# Patient Record
Sex: Female | Born: 1955 | Race: White | Hispanic: No | Marital: Single | Smoking: Never smoker
Health system: Southern US, Community
[De-identification: ages and names within clinical notes are randomized; demographics above are authoritative.]

## PROBLEM LIST (undated history)

## (undated) DIAGNOSIS — R011 Cardiac murmur, unspecified: Secondary | ICD-10-CM

## (undated) DIAGNOSIS — G629 Polyneuropathy, unspecified: Secondary | ICD-10-CM

## (undated) DIAGNOSIS — H269 Unspecified cataract: Secondary | ICD-10-CM

## (undated) DIAGNOSIS — K289 Gastrojejunal ulcer, unspecified as acute or chronic, without hemorrhage or perforation: Secondary | ICD-10-CM

## (undated) DIAGNOSIS — E119 Type 2 diabetes mellitus without complications: Secondary | ICD-10-CM

## (undated) DIAGNOSIS — Z9289 Personal history of other medical treatment: Secondary | ICD-10-CM

## (undated) DIAGNOSIS — H409 Unspecified glaucoma: Secondary | ICD-10-CM

## (undated) DIAGNOSIS — J302 Other seasonal allergic rhinitis: Secondary | ICD-10-CM

## (undated) DIAGNOSIS — I10 Essential (primary) hypertension: Secondary | ICD-10-CM

## (undated) DIAGNOSIS — K219 Gastro-esophageal reflux disease without esophagitis: Secondary | ICD-10-CM

## (undated) DIAGNOSIS — Z993 Dependence on wheelchair: Secondary | ICD-10-CM

## (undated) HISTORY — PX: TONSILLECTOMY: SUR1361

## (undated) HISTORY — PX: WISDOM TOOTH EXTRACTION: SHX21

## (undated) HISTORY — PX: FRACTURE SURGERY: SHX138

## (undated) HISTORY — PX: CATARACT EXTRACTION: SUR2

## (undated) HISTORY — PX: EYE SURGERY: SHX253

## (undated) HISTORY — PX: UPPER GI ENDOSCOPY: SHX6162

## (undated) HISTORY — PX: COLONOSCOPY: SHX174

## (undated) HISTORY — PX: RETINAL DETACHMENT SURGERY: SHX105

## (undated) HISTORY — PX: BREAST SURGERY: SHX581

## (undated) HISTORY — PX: ABDOMINAL HYSTERECTOMY: SHX81

## (undated) HISTORY — PX: TONSILLECTOMY AND ADENOIDECTOMY: SHX28

---

## 2003-12-26 ENCOUNTER — Encounter: Admission: RE | Admit: 2003-12-26 | Discharge: 2003-12-26 | Payer: Self-pay | Admitting: Gastroenterology

## 2005-02-20 ENCOUNTER — Emergency Department (HOSPITAL_COMMUNITY): Admission: EM | Admit: 2005-02-20 | Discharge: 2005-02-20 | Payer: Self-pay | Admitting: Emergency Medicine

## 2005-02-21 ENCOUNTER — Emergency Department (HOSPITAL_COMMUNITY): Admission: EM | Admit: 2005-02-21 | Discharge: 2005-02-21 | Payer: Self-pay | Admitting: Emergency Medicine

## 2005-02-27 ENCOUNTER — Inpatient Hospital Stay (HOSPITAL_COMMUNITY): Admission: EM | Admit: 2005-02-27 | Discharge: 2005-03-06 | Payer: Self-pay | Admitting: Emergency Medicine

## 2005-02-27 ENCOUNTER — Encounter: Admission: RE | Admit: 2005-02-27 | Discharge: 2005-02-27 | Payer: Self-pay | Admitting: *Deleted

## 2006-02-23 ENCOUNTER — Emergency Department (HOSPITAL_COMMUNITY): Admission: EM | Admit: 2006-02-23 | Discharge: 2006-02-23 | Payer: Self-pay | Admitting: Emergency Medicine

## 2010-07-07 ENCOUNTER — Emergency Department (HOSPITAL_COMMUNITY): Admission: EM | Admit: 2010-07-07 | Discharge: 2010-07-07 | Payer: Self-pay | Admitting: Emergency Medicine

## 2010-10-28 LAB — URINE CULTURE: Colony Count: 35000

## 2010-10-28 LAB — CBC
HCT: 37.6 % (ref 36.0–46.0)
MCH: 29.2 pg (ref 26.0–34.0)
MCV: 85.2 fL (ref 78.0–100.0)
Platelets: 220 10*3/uL (ref 150–400)
RDW: 13.3 % (ref 11.5–15.5)
WBC: 6.6 10*3/uL (ref 4.0–10.5)

## 2010-10-28 LAB — URINALYSIS, ROUTINE W REFLEX MICROSCOPIC
Bilirubin Urine: NEGATIVE
Ketones, ur: 15 mg/dL — AB
Nitrite: NEGATIVE
Protein, ur: NEGATIVE mg/dL
Urobilinogen, UA: 0.2 mg/dL (ref 0.0–1.0)

## 2010-10-28 LAB — HEPATIC FUNCTION PANEL
ALT: 16 U/L (ref 0–35)
Alkaline Phosphatase: 68 U/L (ref 39–117)
Bilirubin, Direct: 0.1 mg/dL (ref 0.0–0.3)
Indirect Bilirubin: 0.6 mg/dL (ref 0.3–0.9)
Total Protein: 7 g/dL (ref 6.0–8.3)

## 2010-10-28 LAB — POCT I-STAT, CHEM 8
Calcium, Ion: 1.07 mmol/L — ABNORMAL LOW (ref 1.12–1.32)
Creatinine, Ser: 1 mg/dL (ref 0.4–1.2)
Glucose, Bld: 319 mg/dL — ABNORMAL HIGH (ref 70–99)
HCT: 42 % (ref 36.0–46.0)
Hemoglobin: 14.3 g/dL (ref 12.0–15.0)
TCO2: 28 mmol/L (ref 0–100)

## 2010-10-28 LAB — PROTIME-INR: Prothrombin Time: 13 seconds (ref 11.6–15.2)

## 2010-10-28 LAB — URINE MICROSCOPIC-ADD ON

## 2010-10-28 LAB — DIFFERENTIAL
Eosinophils Absolute: 0 10*3/uL (ref 0.0–0.7)
Eosinophils Relative: 1 % (ref 0–5)
Lymphocytes Relative: 20 % (ref 12–46)
Lymphs Abs: 1.3 10*3/uL (ref 0.7–4.0)
Monocytes Absolute: 0.4 10*3/uL (ref 0.1–1.0)

## 2011-01-02 NOTE — Discharge Summary (Signed)
NAMESANAZ, [name]            ACCOUNT NO.:  [id number]   MEDICAL RECORD NO.:  [id number]          PATIENT TYPE:  INP   LOCATION:  1430                         FACILITY:  [facility]   PHYSICIAN:  Vikki Ports, MDDATE OF BIRTH:  [birth year]   DATE OF ADMISSION:  02/27/2005  DATE OF DISCHARGE:  03/06/2005                                 DISCHARGE SUMMARY   ADMISSION DIAGNOSIS:  History of perforated ulcer managed medically.   The patient now presents with some abdominal pain and on CAT scan was found  to have increased paraduodenal fluid collection. The patient was placed with  an NG tube and antibiotics and was followed closely over the next few days  while she remained n.p.o. Her pain got much better. Her repeat CAT scan  showed no evidence of gastric outlet obstruction. I clamped her NG tube. She  tolerated that well. We stopped her TNA, began feeding her, and she was  ready for discharge home.   Follow-up is with me in 2 weeks.   CONDITION ON DISCHARGE:  Good and improved.      Vikki Ports, MD  Electronically Signed     KRH/MEDQ  D:  03/26/2005  T:  03/26/2005  Job:  [id number]

## 2011-01-02 NOTE — Consult Note (Signed)
NAMEDALONDA, [name]            ACCOUNT NO.:  [id number]   MEDICAL RECORD NO.:  [id number]          PATIENT TYPE:  EMS   LOCATION:  ED                           FACILITY:  [facility]   PHYSICIAN:  Vikki Ports, MDDATE OF BIRTH:  May 11, 1956   DATE OF CONSULTATION:  02/20/2005  DATE OF DISCHARGE:                                   CONSULTATION   DIAGNOSIS:  Abdominal pain, rule out duodenal ulcer.   HISTORY OF PRESENT ILLNESS:  Patient is a 55 year old white female who  presented to the emergency room with a one-day history of worsening  abdominal pain.  She was seen here and evaluated by Dr. Berenice Bouton, who  ordered a CT scan.  The CT scan showed a small amount of fluid in the right  upper quadrant adjacent to the duodenum and paraduodenal fatty inflammation.  No evidence of free air.   On request for my consultation, the patient was completely painfree without  pain medicine for the last five hours.  She is hungry at this point and  denies any abdominal pain whatsoever.  She did suffer a tib/fib fracture  from a fall a number of weeks ago and had external fixator placed after  ORIF.  The patient denies any use of NSAIDs.  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 Nadine Counts Buccini for the last three years.   PAST MEDICAL HISTORY:  Insulin-dependent diabetes mellitus x40 years.   MEDICATIONS:  1.  Insulin.  2.  Oxycodone.   REVIEW OF SYSTEMS:  Significant for some nausea and periumbilical abdominal  pain, right lower quadrant pain, which is now resolved.  Otherwise, no  respiratory, cardiac, neurologic, or urinary tract symptoms.   PHYSICAL EXAMINATION:  VITAL SIGNS:  Temperature 100.2, heart rate 84.  GENERAL:  She is an age-appropriate white female in next day.  HEENT:  Benign.  Normocephalic and atraumatic.  Pupils are equal, round and  reactive to light.  NECK:  Soft and supple without thyromegaly or cervical  adenopathy.  LUNGS:  Clear to auscultation and percussion x2.  HEART:  Regular rate and rhythm.  No murmurs, rubs or gallops.  Normal PMI.  ABDOMEN:  Soft.  Completely nontender with no hepatosplenomegaly.  EXTREMITIES:  Normal muscular tone and external fixator on the right lower  extremity.  Pulses 2+ throughout, including femoral DP's and radial pulses.   White count is 9.3 thousand.   I reviewed her CT scan.   IMPRESSION:  Probable sealed perforated duodenal ulcer, completely  asymptomatic at this time.   PLAN:  Continue PPIs that she is on.  Have her return to the emergency room  in 12 hours for repeat white blood cell count.  Return sooner if she has  worsening abdominal pain or fever.  They are to call me when she returns to  the emergency room so I can follow up and do a repeat clinical exam.       KRH/MEDQ  D:  02/20/2005  T:  02/20/2005  Job:  [id number]

## 2011-01-02 NOTE — H&P (Signed)
NAMERENADA, CRONIN            ACCOUNT NO.:  [id number]   MEDICAL RECORD NO.:  [id number]          PATIENT TYPE:  EMS   LOCATION:  ED                           FACILITY:  [facility]   PHYSICIAN:  Lorne Skeens. Hoxworth, M.D.DATE OF BIRTH:  [birth year]   DATE OF ADMISSION:  02/27/2005  DATE OF DISCHARGE:                                HISTORY & PHYSICAL   CHIEF COMPLAINT:  Nausea, vomiting, and abdominal pain.   HISTORY OF PRESENT ILLNESS:  [name] is a 55 year old white female  with juvenile diabetes who presented to [facility] Emergency Room one week  ago with the onset of abdominal pain. She had had the fairly sudden onset 12-  8 hours prior to evaluation last week of right mid abdominal pain. This was  significant but not severe. She was evaluated in the emergency room at that  time with a CT scan revealing a periduodenal fluid collection about 1 cm in  diameter with some surrounding inflammation felt most consistent with a  walled off perforated duodenal ulcer. The patient was evaluated at that time  by Dr. Luan Pulling and was felt to be stable for outpatient treatment. She  had been on Prilosec long-term due to some mild reflux and esophageal spasm,  evaluated by Dr. Matthias Hughs and she was changed to Protonix p.o. and followed  as an outpatient. She states that her pain remained stable to actually  slightly improved over the course of the week. She would awaken with  minimal, if any, abdominal pain and then would develop some mild to moderate  pain in the epigastrium and right upper quadrant and right mid abdomen as  the day wore on. However over the past 2-3 days, she has developed  progressive nausea and vomiting and has had frequent vomiting over the past  48 hours and has really taken very little p.o.  She has had a low grade  fever. Repeat CT scan was ordered for this afternoon which shows a  significant increase in the periduodenal fluid accumulation and inflammation  as  described below. There is also evidence of gastric outlet obstruction.   The patient denies any previous history of peptic ulcer disease. She has  some food intolerances but no significant chronic GI symptoms. She has been  evaluated for esophageal spasm in the past by Dr. Matthias Hughs and is on Prilosec  for this. No history of Crohn's disease or any other chronic GI disorders.  She has not had any melena, hematochezia or hematemesis. No urinary  symptoms.   PAST MEDICAL HISTORY:  Surgery significant for remote hysterectomy and  initial external fixation and then ORIF of a right tib-fib fracture  performed in [place] in May of this year. Medically, she is followed by Dr.  Evlyn Kanner for insulin dependent juvenile diabetes.   CURRENT MEDICATIONS:  1.  NovoLog sliding scale 8-9 units 3 times a day before meals.  2.  Lantus 9 units at h.s.  3.  Quinapril 20 mg a day for renal prophylaxis.  4.  Protonix 40 mg daily for the past week.   ALLERGIES:  None.   SOCIAL HISTORY:  She is single. Does not smoke cigarettes or drink alcohol.  She is an Pensions consultant.   FAMILY HISTORY:  Mother died of stroke. She has a sister who is diabetic.  Father is relatively well.   REVIEW OF SYMPTOMS:  GENERAL:  Positive for low grade temp and fatigue.  RESPIRATORY:  No shortness of breath, cough, wheezing. HEENT:  No vision,  hearing or swallowing problems. CARDIAC:  No chest pain, palpitations,  swelling. ABDOMEN/GI:  As above. GU:  No urinary burning or frequency.  MUSCULOSKELETAL:  Pain and stiffness in the right leg secondary to recent  surgery progressing well with rehab. NEUROLOGIC:  No numbness, weakness,  syncope.   PHYSICAL EXAMINATION:  VITAL SIGNS:  Temperature is 100.4, pulse is 124 and  regular, blood pressure 144/81, respirations 20.  GENERAL:  She is a thin but well-developed white female in no acute  distress.  SKIN:  Warm and dry, no rash or infection.  HEENT:  No palpable thyromegaly or masses.  Sclera nonicteric. Nares and  oropharynx clear.  LYMPH NODES:  No cervical, supraclavicular or inguinal nodes palpable.  LUNGS:  Clear to auscultation without wheezing or increased work of  breathing.  CARDIAC:  Irregular tachycardia. No murmurs. Peripheral pulses intact. No  edema or JVD.  ABDOMEN:  Well healed low midline incision. No hernias. Minimal if any  distention. Bowel sounds are hypoactive. There is mild to moderate  epigastric and right upper quadrant tenderness with just slight guarding,  certainly no peritoneal signs. No palpable masses. No discernable  organomegaly.  EXTREMITIES:  There is a brace on the right lower extremity with a healing  incision. No edema.  NEUROLOGIC:  Alert and oriented. Motor and sensory exam is grossly normal.   LABORATORY DATA:  White count elevated to 18.4000, hemoglobin 11.9.  Electrolytes, BUN and creatinine are normal. Glucose is 251, albumin 3.0 and  phosphatase 177, bilirubin 1.6.   CT scan of the abdomen and pelvis is personally reviewed which shows a  significant increase in the periduodenal fluid collection septated measuring  about 5 cm in diameter with surrounding inflammation and the stomach is  dilated containing fluid.   ASSESSMENT/PLAN:  Apparent walled off perforated duodenal ulcer with  periduodenal fluid collection and gastric outlet obstruction. Cannot rule  out other cause for perforation or inflammatory change. This is worsening on  outpatient management. The patient will be admitted, started on broad  spectrum antibiotics, double dose IV protonix and nasogastric suction. She  will need to be followed closely for improvement and may well require  laparotomy.       BTH/MEDQ  D:  02/27/2005  T:  02/27/2005  Job:  [id number]

## 2012-10-17 ENCOUNTER — Emergency Department (HOSPITAL_COMMUNITY)
Admission: EM | Admit: 2012-10-17 | Discharge: 2012-10-17 | Disposition: A | Discharge status: Home / Self Care | Payer: Managed Care, Other (non HMO) | Attending: Emergency Medicine | Admitting: Emergency Medicine

## 2012-10-17 DIAGNOSIS — Z79899 Other long term (current) drug therapy: Secondary | ICD-10-CM | POA: Insufficient documentation

## 2012-10-17 DIAGNOSIS — R1013 Epigastric pain: Secondary | ICD-10-CM | POA: Insufficient documentation

## 2012-10-17 DIAGNOSIS — Z794 Long term (current) use of insulin: Secondary | ICD-10-CM | POA: Insufficient documentation

## 2012-10-17 DIAGNOSIS — E1169 Type 2 diabetes mellitus with other specified complication: Secondary | ICD-10-CM | POA: Insufficient documentation

## 2012-10-17 DIAGNOSIS — R000 Tachycardia, unspecified: Secondary | ICD-10-CM | POA: Insufficient documentation

## 2012-10-17 DIAGNOSIS — R109 Unspecified abdominal pain: Secondary | ICD-10-CM

## 2012-10-17 DIAGNOSIS — Z8711 Personal history of peptic ulcer disease: Secondary | ICD-10-CM | POA: Insufficient documentation

## 2012-10-17 LAB — CBC WITH DIFFERENTIAL/PLATELET
Eosinophils Relative: 0 % (ref 0–5)
HCT: 38.4 % (ref 36.0–46.0)
Hemoglobin: 13.3 g/dL (ref 12.0–15.0)
Lymphocytes Relative: 12 % (ref 12–46)
Lymphs Abs: 1.1 10*3/uL (ref 0.7–4.0)
MCH: 28.3 pg (ref 26.0–34.0)
MCV: 81.7 fL (ref 78.0–100.0)
Monocytes Absolute: 0.4 10*3/uL (ref 0.1–1.0)
Monocytes Relative: 4 % (ref 3–12)
Platelets: 254 10*3/uL (ref 150–400)
RBC: 4.7 MIL/uL (ref 3.87–5.11)
WBC: 9.1 10*3/uL (ref 4.0–10.5)

## 2012-10-17 LAB — COMPREHENSIVE METABOLIC PANEL
ALT: 14 U/L (ref 0–35)
BUN: 14 mg/dL (ref 6–23)
CO2: 26 mEq/L (ref 19–32)
Calcium: 9.9 mg/dL (ref 8.4–10.5)
GFR calc Af Amer: 81 mL/min — ABNORMAL LOW (ref >90)
GFR calc non Af Amer: 70 mL/min — ABNORMAL LOW (ref >90)
Glucose, Bld: 397 mg/dL — ABNORMAL HIGH (ref 70–99)
Sodium: 132 mEq/L — ABNORMAL LOW (ref 135–145)

## 2012-10-17 LAB — LIPASE, BLOOD: Lipase: 25 U/L (ref 11–59)

## 2012-10-17 MED ORDER — SUCRALFATE 1 G PO TABS
1.0000 g | ORAL_TABLET | Freq: Three times a day (TID) | ORAL | Status: DC
  Administered 2012-10-17: 1 g via ORAL
  Filled 2012-10-17 (×4): qty 1

## 2012-10-17 MED ORDER — SUCRALFATE 1 G PO TABS: 1.0000 g | ORAL_TABLET | Freq: Four times a day (QID) | ORAL | Status: AC

## 2012-10-17 MED ORDER — SODIUM CHLORIDE 0.9 % IV SOLN
INTRAVENOUS | Status: DC
  Administered 2012-10-17: 12:00:00 via INTRAVENOUS

## 2012-10-17 MED ORDER — GI COCKTAIL ~~LOC~~
30.0000 mL | Freq: Once | ORAL | Status: AC
  Administered 2012-10-17: 30 mL via ORAL
  Filled 2012-10-17: qty 30

## 2012-10-17 NOTE — ED Provider Notes (Signed)
History     CSN: [id number]  Arrival date & time 10/17/12  1043   First MD Initiated Contact with Patient 10/17/12 1133      Chief Complaint  Patient presents with  . Back Pain  . Nausea    (Consider location/radiation/quality/duration/timing/severity/associated sxs/prior treatment) Patient is a 57 y.o. female presenting with back pain. The history is provided by the patient.  Back Pain  Patient here complaining of abdominal pain and back pain for the past 2 weeks. Pain is characterized as burning and possibly worse with food. Denies associated vomiting, fever, chills. No black or bloody stools. Has been using her antacids without relief. Denies any radicular symptoms to her back pain. No urinary symptoms of hematuria or dysuria. No vaginal bleeding noted. Has not been seen by her Dr. for this. Does have a history of peptic ulcer disease. No past medical history on file.  No past surgical history on file.  No family history on file.  History  Substance Use Topics  . Smoking status: Not on file  . Smokeless tobacco: Not on file  . Alcohol Use: Not on file    OB History   No data available      Review of Systems  Musculoskeletal: Positive for back pain.  All other systems reviewed and are negative.    Allergies  Cephalosporins; Macrobid; and Sulfa antibiotics  Home Medications   Current Outpatient Rx  Name  Route  Sig  Dispense  Refill  . calcium carbonate (TUMS - DOSED IN MG ELEMENTAL CALCIUM) 500 MG chewable tablet   Oral   Chew 1 tablet by mouth daily as needed for heartburn.         . ergocalciferol (VITAMIN D2) 50000 UNITS capsule   Oral   Take 50,000 Units by mouth once a week. Wednesday         . insulin aspart (NOVOLOG) 100 UNIT/ML injection   Subcutaneous   Inject 6 Units into the skin 3 (three) times daily before meals. Pt uses sliding scale         . insulin glargine (LANTUS) 100 UNIT/ML injection   Subcutaneous   Inject 8 Units into the  skin at bedtime.         . Omeprazole Magnesium (PRILOSEC OTC PO)   Oral   Take 2 tablets by mouth daily.         . quinapril (ACCUPRIL) 40 MG tablet   Oral   Take 40 mg by mouth at bedtime.           BP 157/77  Pulse 108  Temp(Src) 98 F (36.7 C) (Oral)  Resp 18  SpO2 99%  Physical Exam  Nursing note and vitals reviewed. Constitutional: She is oriented to person, place, and time. She appears well-developed and well-nourished.  Non-toxic appearance. No distress.  HENT:  Head: Normocephalic and atraumatic.  Eyes: Conjunctivae, EOM and lids are normal. Pupils are equal, round, and reactive to light.  Neck: Normal range of motion. Neck supple. No tracheal deviation present. No mass present.  Cardiovascular: Regular rhythm and normal heart sounds.  Tachycardia present.  Exam reveals no gallop.   No murmur heard. Pulmonary/Chest: Effort normal and breath sounds normal. No stridor. No respiratory distress. She has no decreased breath sounds. She has no wheezes. She has no rhonchi. She has no rales.  Abdominal: Soft. Normal appearance and bowel sounds are normal. She exhibits no distension. There is tenderness in the epigastric area. There is no rebound and  no CVA tenderness.  Musculoskeletal: Normal range of motion. She exhibits no edema and no tenderness.  Neurological: She is alert and oriented to person, place, and time. She has normal strength. No cranial nerve deficit or sensory deficit. GCS eye subscore is 4. GCS verbal subscore is 5. GCS motor subscore is 6.  Skin: Skin is warm and dry. No abrasion and no rash noted.  Psychiatric: She has a normal mood and affect. Her speech is normal and behavior is normal.    ED Course  Procedures (including critical care time)  Labs Reviewed  CBC WITH DIFFERENTIAL  COMPREHENSIVE METABOLIC PANEL  LIPASE, BLOOD   No results found.   No diagnosis found.    MDM  Spoke with patient about her elevated blood sugar and she states  that she will manage this herself. Patient given meds for Suspected peptic ulcer disease and she feels much better. Patient's abdomen reexamined multiple times and is nonsurgical at this time. Suspect that patient has peptic ulcer disease will place her on Carafate. She is articulate PPI. She does have a gastroenterologist that she will see in followup and she was given return precautions        Toy Baker, MD 10/17/12 1517

## 2012-10-17 NOTE — ED Notes (Signed)
Pt c/o intermittent back pain over the past 2 weeks. Pt reports pain is aggravated by "laying against something." Pt reports taking a tylenol upon waking up this am which did help.

## 2013-04-25 ENCOUNTER — Other Ambulatory Visit: Payer: Self-pay | Admitting: Gastroenterology

## 2013-04-25 DIAGNOSIS — R1010 Upper abdominal pain, unspecified: Secondary | ICD-10-CM

## 2013-05-01 ENCOUNTER — Ambulatory Visit
Admission: RE | Admit: 2013-05-01 | Discharge: 2013-05-01 | Disposition: A | Discharge status: Home / Self Care | Payer: Managed Care, Other (non HMO) | Source: Ambulatory Visit | Attending: Gastroenterology | Admitting: Gastroenterology

## 2013-05-01 DIAGNOSIS — R1010 Upper abdominal pain, unspecified: Secondary | ICD-10-CM

## 2014-06-15 ENCOUNTER — Encounter (HOSPITAL_COMMUNITY): Payer: Self-pay | Admitting: Emergency Medicine

## 2014-06-15 ENCOUNTER — Emergency Department (HOSPITAL_COMMUNITY)
Admission: EM | Admit: 2014-06-15 | Discharge: 2014-06-15 | Disposition: A | Discharge status: Home / Self Care | Payer: Managed Care, Other (non HMO) | Attending: Emergency Medicine | Admitting: Emergency Medicine

## 2014-06-15 ENCOUNTER — Emergency Department (HOSPITAL_COMMUNITY): Payer: Managed Care, Other (non HMO)

## 2014-06-15 DIAGNOSIS — Z794 Long term (current) use of insulin: Secondary | ICD-10-CM | POA: Insufficient documentation

## 2014-06-15 DIAGNOSIS — E1140 Type 2 diabetes mellitus with diabetic neuropathy, unspecified: Secondary | ICD-10-CM | POA: Insufficient documentation

## 2014-06-15 DIAGNOSIS — F439 Reaction to severe stress, unspecified: Secondary | ICD-10-CM

## 2014-06-15 DIAGNOSIS — R51 Headache: Secondary | ICD-10-CM | POA: Insufficient documentation

## 2014-06-15 DIAGNOSIS — R110 Nausea: Secondary | ICD-10-CM | POA: Insufficient documentation

## 2014-06-15 DIAGNOSIS — R457 State of emotional shock and stress, unspecified: Secondary | ICD-10-CM | POA: Insufficient documentation

## 2014-06-15 DIAGNOSIS — Z79899 Other long term (current) drug therapy: Secondary | ICD-10-CM | POA: Diagnosis not present

## 2014-06-15 DIAGNOSIS — R519 Headache, unspecified: Secondary | ICD-10-CM

## 2014-06-15 HISTORY — DX: Polyneuropathy, unspecified: G62.9

## 2014-06-15 HISTORY — DX: Type 2 diabetes mellitus without complications: E11.9

## 2014-06-15 LAB — BASIC METABOLIC PANEL
Anion gap: 14 (ref 5–15)
BUN: 16 mg/dL (ref 6–23)
CO2: 27 mEq/L (ref 19–32)
Calcium: 9.7 mg/dL (ref 8.4–10.5)
Chloride: 93 mEq/L — ABNORMAL LOW (ref 96–112)
Creatinine, Ser: 0.86 mg/dL (ref 0.50–1.10)
GFR calc Af Amer: 84 mL/min — ABNORMAL LOW (ref >90)
GFR calc non Af Amer: 73 mL/min — ABNORMAL LOW (ref >90)
Glucose, Bld: 253 mg/dL — ABNORMAL HIGH (ref 70–99)
Potassium: 4.2 mEq/L (ref 3.7–5.3)
Sodium: 134 mEq/L — ABNORMAL LOW (ref 137–147)

## 2014-06-15 LAB — CBC
HCT: 39.1 % (ref 36.0–46.0)
Hemoglobin: 12.8 g/dL (ref 12.0–15.0)
MCH: 27.2 pg (ref 26.0–34.0)
MCHC: 32.7 g/dL (ref 30.0–36.0)
MCV: 83 fL (ref 78.0–100.0)
Platelets: 229 10*3/uL (ref 150–400)
RBC: 4.71 MIL/uL (ref 3.87–5.11)
RDW: 13.6 % (ref 11.5–15.5)
WBC: 7.7 10*3/uL (ref 4.0–10.5)

## 2014-06-15 NOTE — ED Notes (Signed)
Pt c/o intermittent headaches that have been going on for couple weeks.  Pt states that this morning she had headache and nausea and then felt better. Pt states that she has hit her head a couple times several weeks ago on a "well placed bookshelf".

## 2014-06-15 NOTE — ED Notes (Signed)
Patient transported to CT 

## 2014-06-15 NOTE — ED Provider Notes (Signed)
CSN: [id number]     Arrival date & time 06/15/14  [id number] History   First MD Initiated Contact with Patient 06/15/14 1026     Chief Complaint  Patient presents with  . Headache  . Nausea     (Consider location/radiation/quality/duration/timing/severity/associated sxs/prior Treatment) HPI Pt is a [age]y of female with hx of diabetes and diabetic neuropathy presenting to ED with c/o increased frequency of intermittent dull headaches that last 10-30 minutes at a time, 6/10 at worst, currently 1/10, associated with nausea but no vomiting. Pt states headaches typically resolve on their own with rest or when she eats. States headaches are never "bad enough" to take pain medication. Pt is concerned due to increased in frequency over last 3-4 weeks, having had 3 headaches this week. Pt also reports hitting her head on a "well placed bookshelf" 2-3 weeks ago and also hit her head on a freezer door. States she never lost consciousness and did not have headaches or falls with these separate incidents but is now concerned as her headaches are in the same area of impact, on top of her head.  Pt states she has also had frontal headaches.  Denies taking blood thinners.  Pt does report being under constant stress in her life, did not want to elaborate.  Pt also adds she has had a blood bleed in her left eye "a while back" due to her diabetes and is seen by an ophthalmologist for same but is concerned this may be the cause of increased headaches. Denies alcohol or drug use. Denies hx of seizures. Denies fever, chills, vomiting, change in balance, or numbness in arms or legs.   Past Medical History  Diagnosis Date  . Diabetes mellitus without complication   . Neuropathy    Past Surgical History  Procedure Laterality Date  . Fracture surgery     No family history on file. History  Substance Use Topics  . Smoking status: Not on file  . Smokeless tobacco: Not on file  . Alcohol Use: No   OB History   Grav Para  Term Preterm Abortions TAB SAB Ect Mult Living                 Review of Systems  Constitutional: Negative for fever and chills.  Eyes: Negative for photophobia, pain, redness and visual disturbance.  Respiratory: Negative for cough and shortness of breath.   Gastrointestinal: Positive for nausea. Negative for vomiting, abdominal pain, diarrhea and constipation.  Musculoskeletal: Negative for myalgias, neck pain and neck stiffness.  Neurological: Positive for headaches. Negative for dizziness, tremors, seizures, syncope, weakness and light-headedness.  All other systems reviewed and are negative.     Allergies  Cephalosporins; Garlic; Macrobid; Onion; and Sulfa antibiotics  Home Medications   Prior to Admission medications   Medication Sig Start Date End Date Taking? Authorizing Provider  calcium carbonate (TUMS - DOSED IN MG ELEMENTAL CALCIUM) 500 MG chewable tablet Chew 1 tablet by mouth daily as needed for heartburn.    Historical Provider, MD  ergocalciferol (VITAMIN D2) 50000 UNITS capsule Take 50,000 Units by mouth once a week. Wednesday    Historical Provider, MD  insulin aspart (NOVOLOG) 100 UNIT/ML injection Inject 6 Units into the skin 3 (three) times daily before meals. Pt uses sliding scale    Historical Provider, MD  insulin glargine (LANTUS) 100 UNIT/ML injection Inject 8 Units into the skin at bedtime.    Historical Provider, MD  Omeprazole Magnesium (PRILOSEC OTC PO) Take 2 tablets  by mouth daily.    Historical Provider, MD  quinapril (ACCUPRIL) 40 MG tablet Take 40 mg by mouth at bedtime.    Historical Provider, MD  sucralfate (CARAFATE) 1 G tablet Take 1 tablet (1 g total) by mouth 4 (four) times daily. 10/17/12   Toy BakerAnthony T Allen, MD   BP 142/62  Pulse 105  Temp(Src) 97.8 F (36.6 C) (Oral)  Resp 18  SpO2 97% Physical Exam  Nursing note and vitals reviewed. Constitutional: She is oriented to person, place, and time. She appears well-developed and well-nourished.  No distress.  Pt lying comfortably in exam bed, NAD.  HENT:  Head: Normocephalic and atraumatic.  Eyes: Conjunctivae and EOM are normal. Pupils are equal, round, and reactive to light. Right eye exhibits no discharge. Left eye exhibits no discharge. No scleral icterus.  Neck: Normal range of motion. Neck supple.  No nuchal rigidity or meningeal signs.  Cardiovascular: Normal rate, regular rhythm and normal heart sounds.   Pulmonary/Chest: Effort normal and breath sounds normal. No respiratory distress. She has no wheezes. She has no rales. She exhibits no tenderness.  Abdominal: Soft. Bowel sounds are normal. She exhibits no distension and no mass. There is no tenderness. There is no rebound and no guarding.  Musculoskeletal: Normal range of motion.  Neurological: She is alert and oriented to person, place, and time. She has normal strength. No cranial nerve deficit or sensory deficit. She displays a negative Romberg sign. Coordination and gait normal. GCS eye subscore is 4. GCS verbal subscore is 5. GCS motor subscore is 6.  CN II-XII grossly in tact. Speech is fluent. 5/5 strength in upper and lower extremities. Normal coordination. Normal gait.   Skin: Skin is warm and dry. She is not diaphoretic.    ED Course  Procedures (including critical care time) Labs Review Labs Reviewed  BASIC METABOLIC PANEL - Abnormal; Notable for the following:    Sodium 134 (*)    Chloride 93 (*)    Glucose, Bld 253 (*)    GFR calc non Af Amer 73 (*)    GFR calc Af Amer 84 (*)    All other components within normal limits  CBC    Imaging Review Ct Head Wo Contrast  06/15/2014   CLINICAL DATA:  Intermittent headaches for a couple weeks.  Nausea  EXAM: CT HEAD WITHOUT CONTRAST  TECHNIQUE: Contiguous axial images were obtained from the base of the skull through the vertex without intravenous contrast.  COMPARISON:  None.  FINDINGS: No acute intracranial abnormality. Specifically, no hemorrhage,  hydrocephalus, mass lesion, acute infarction, or significant intracranial injury. No acute calvarial abnormality. Visualized paranasal sinuses and mastoids clear. Orbital soft tissues unremarkable.  IMPRESSION: No acute intracranial abnormality.   Electronically Signed   By: Charlett NoseKevin  Dover M.D.   On: 06/15/2014 11:25     EKG Interpretation None      MDM   Final diagnoses:  Increased frequency of headaches  Stress   Pt concerned for increased frequency of headaches over last week. Reports hitting her head twice at home, once on a bookshelf and once in the kitchen a few weeks ago.  Denies fall or LOC with these incidents. Pt does report hx of increased stress and reports headaches improve with eating. Denies hx of migraines. No focal neuro deficit. No meningeal signs. PERRL. EOM in tact. Low suspicion for [facility] or other intracranial bleed, however, due to reports of increased frequency of headaches and pt's concern for emergent cause of increased  frequency of headaches, head CT ordered.   CT head: unremarkable. Reassured pt. Advised pt she may have acetaminophen or ibuprofen as needed for headaches. Advised to f/u with her PCP next week for recheck of symptoms. Home care instructions provided. Return precautions provided. Pt verbalized understanding and agreement with tx plan.   Junius Finner, PA-C 06/15/14 [phone]

## 2014-06-16 NOTE — ED Provider Notes (Signed)
Medical screening examination/treatment/procedure(s) were performed by non-physician practitioner and as supervising physician I was immediately available for consultation/collaboration.   EKG Interpretation None       Ethelda ChickMartha K Linker, MD 06/16/14 [phone]

## 2016-03-25 IMAGING — CT CT HEAD W/O CM
2 series · 16 of 30 positions shown, 20 images · non-contrast
Comparison: None.

CLINICAL DATA: Intermittent headaches for a couple weeks.  Nausea

EXAM:
CT HEAD WITHOUT CONTRAST
TECHNIQUE: Contiguous axial images were obtained from the base of the skull
through the vertex without intravenous contrast.

[Series 2: head w/o · axial · non-contrast · 0.45mm/px · z∈[-105,+15]mm · 13 of 29 slices shown, 17 images]
[im 3/29  brain]
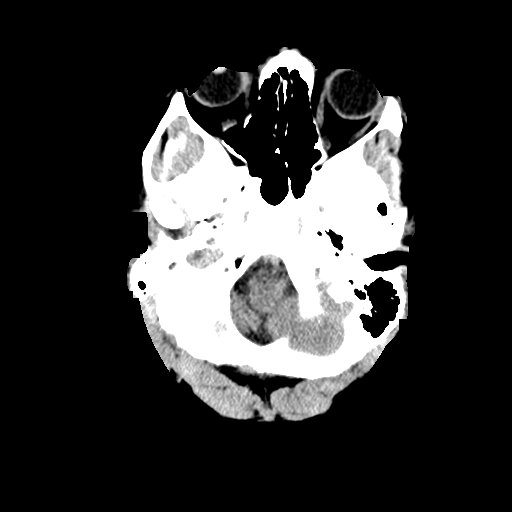
[im 3/29  bone]
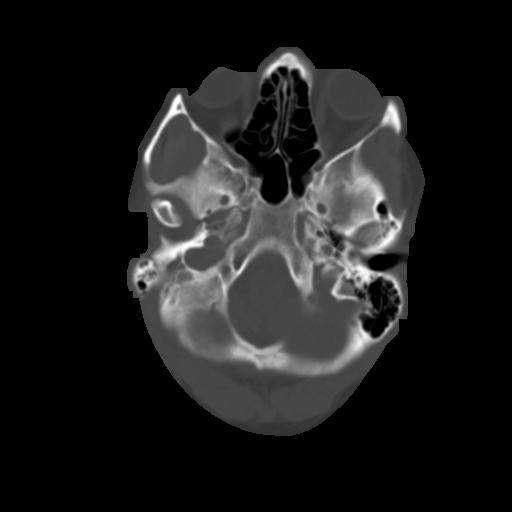
[im 5/29  brain]
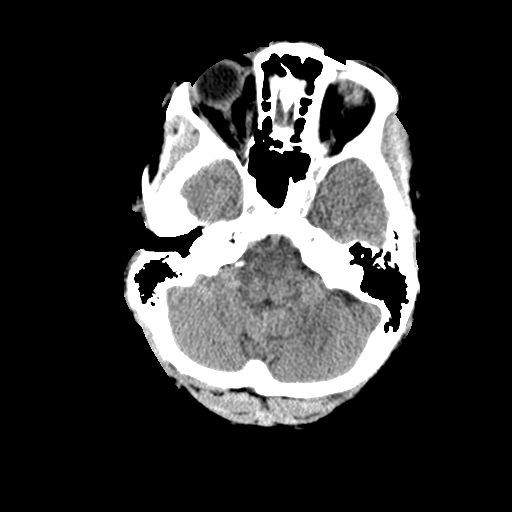
[im 7/29  brain]
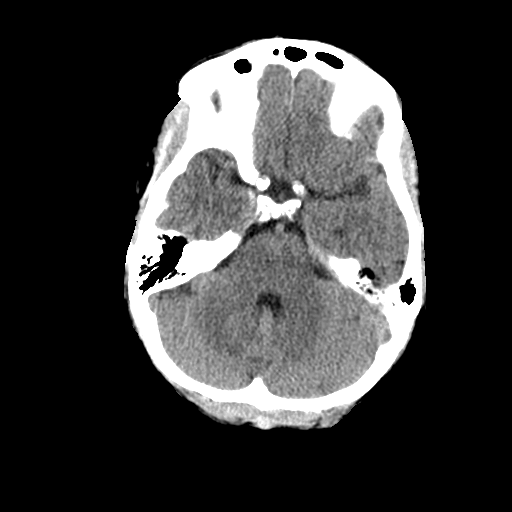
[im 9/29  brain]
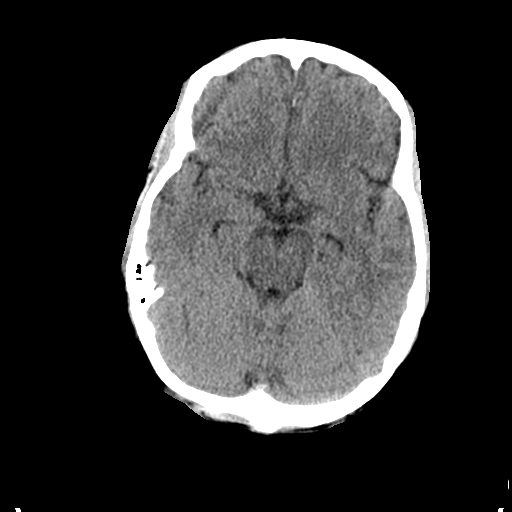
[im 11/29  brain]
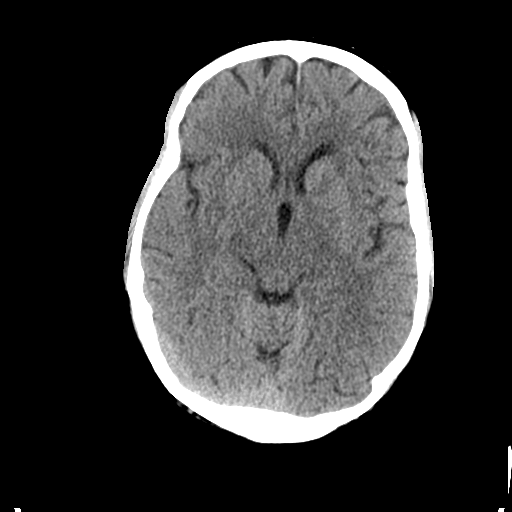
[im 11/29  bone]
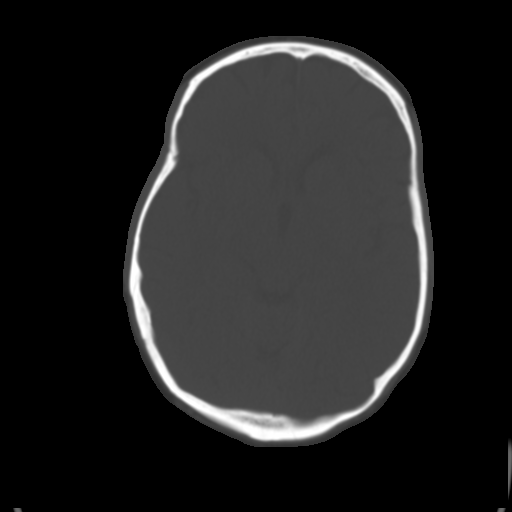
[im 13/29  brain]
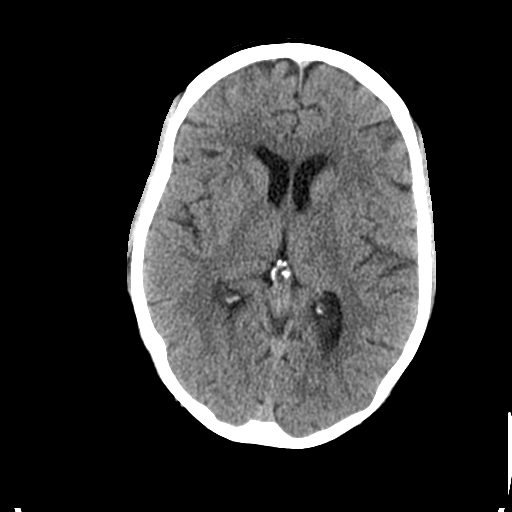
[im 15/29  brain]
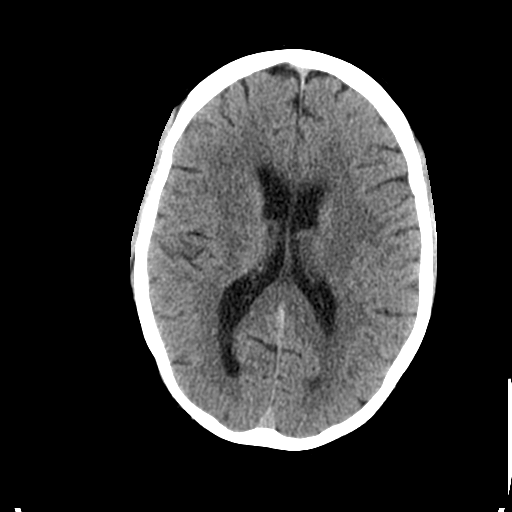
[im 17/29  brain]
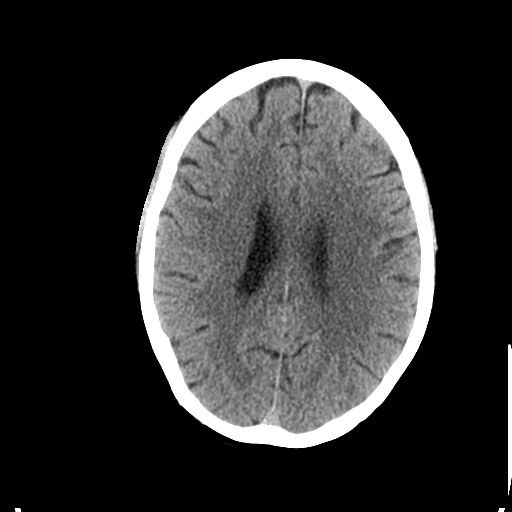
[im 19/29  brain]
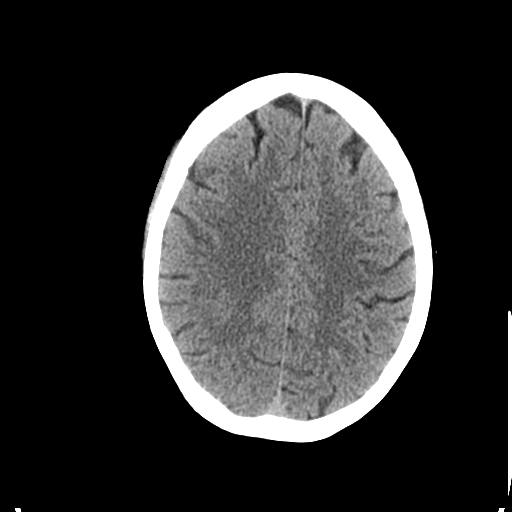
[im 19/29  bone]
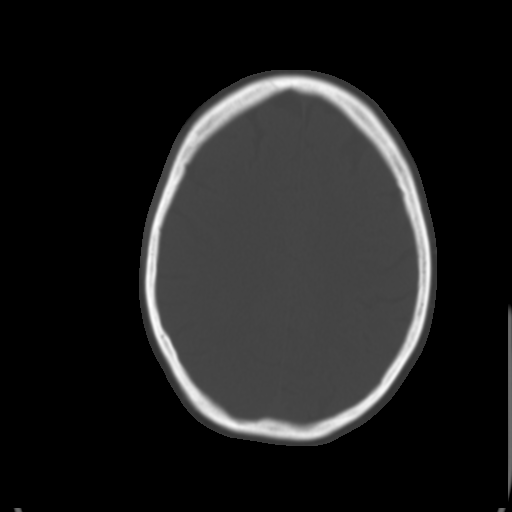
[im 21/29  brain]
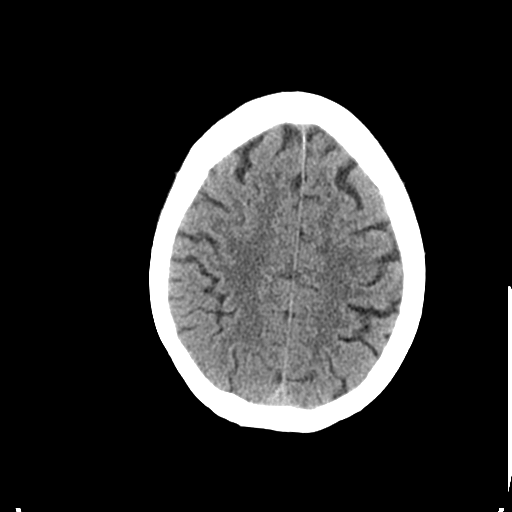
[im 23/29  brain]
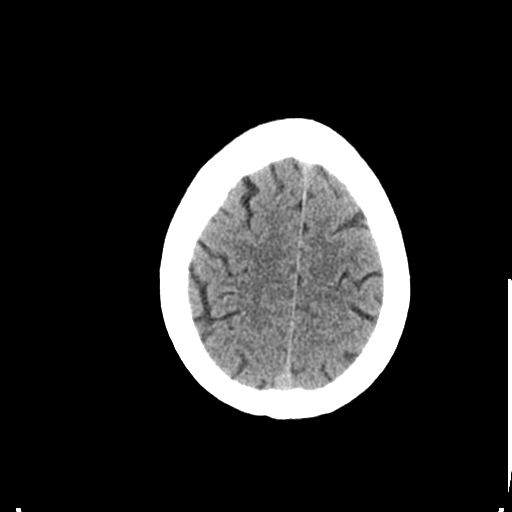
[im 25/29  brain]
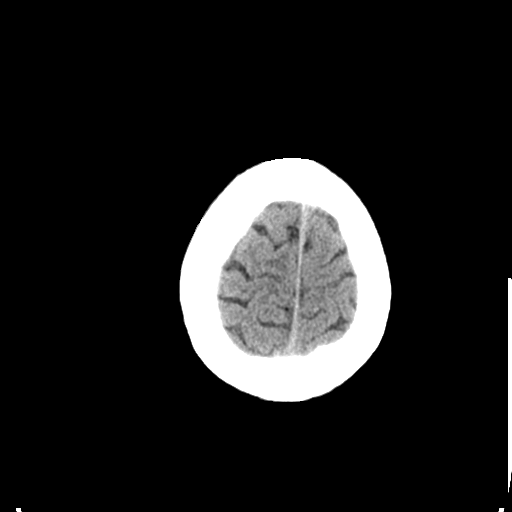
[im 27/29  brain]
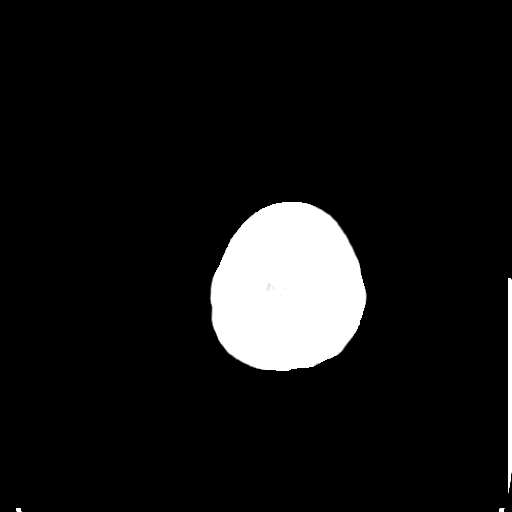
[im 27/29  bone]
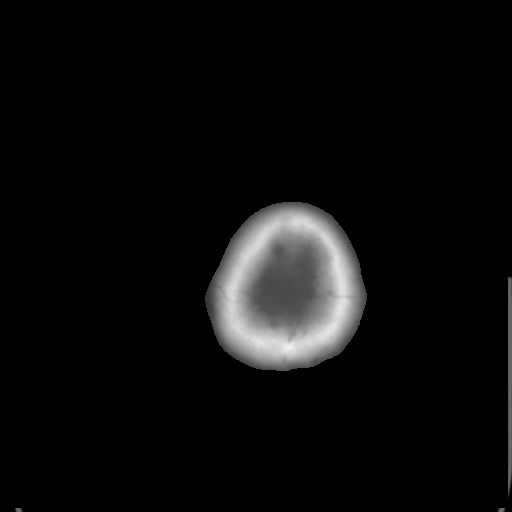

[Series 3: bone windows · axial · 0.45mm/px · z∈[-105,-65]mm · 3 of 29 slices shown]
[im 3/29  bone]
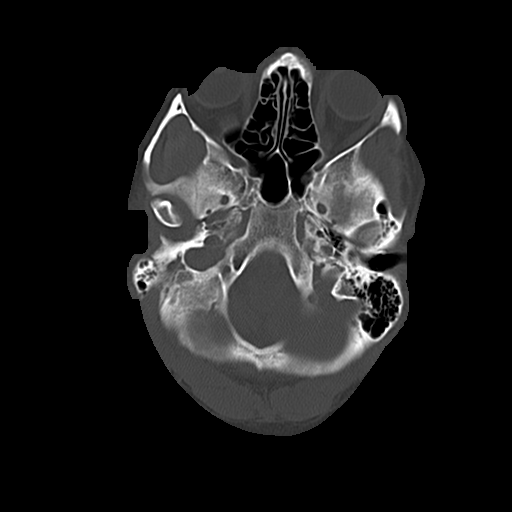
[im 7/29  bone]
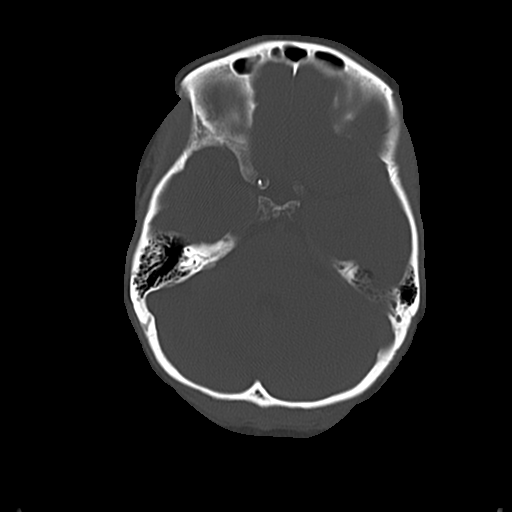
[im 11/29  bone]
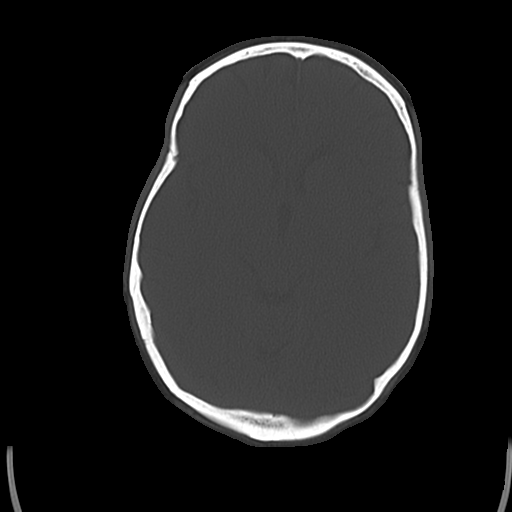

[16 of 30 positions shown; findings below may reference images not displayed]

FINDINGS: No acute intracranial abnormality. Specifically, no hemorrhage,
hydrocephalus, mass lesion, acute infarction, or significant
intracranial injury. No acute calvarial abnormality. Visualized
paranasal sinuses and mastoids clear. Orbital soft tissues
unremarkable.
IMPRESSION: No acute intracranial abnormality.

## 2016-09-24 DIAGNOSIS — H33002 Unspecified retinal detachment with retinal break, left eye: Secondary | ICD-10-CM | POA: Diagnosis not present

## 2016-09-24 DIAGNOSIS — H40003 Preglaucoma, unspecified, bilateral: Secondary | ICD-10-CM | POA: Diagnosis not present

## 2016-09-24 DIAGNOSIS — E113593 Type 2 diabetes mellitus with proliferative diabetic retinopathy without macular edema, bilateral: Secondary | ICD-10-CM | POA: Diagnosis not present

## 2016-09-24 DIAGNOSIS — H4312 Vitreous hemorrhage, left eye: Secondary | ICD-10-CM | POA: Diagnosis not present

## 2016-10-20 DIAGNOSIS — E113599 Type 2 diabetes mellitus with proliferative diabetic retinopathy without macular edema, unspecified eye: Secondary | ICD-10-CM | POA: Diagnosis not present

## 2016-10-20 DIAGNOSIS — E10319 Type 1 diabetes mellitus with unspecified diabetic retinopathy without macular edema: Secondary | ICD-10-CM | POA: Diagnosis not present

## 2016-10-20 DIAGNOSIS — E559 Vitamin D deficiency, unspecified: Secondary | ICD-10-CM | POA: Diagnosis not present

## 2016-10-20 DIAGNOSIS — I10 Essential (primary) hypertension: Secondary | ICD-10-CM | POA: Diagnosis not present

## 2016-10-20 DIAGNOSIS — Z23 Encounter for immunization: Secondary | ICD-10-CM | POA: Diagnosis not present

## 2016-12-03 DIAGNOSIS — H40003 Preglaucoma, unspecified, bilateral: Secondary | ICD-10-CM | POA: Diagnosis not present

## 2016-12-03 DIAGNOSIS — E113593 Type 2 diabetes mellitus with proliferative diabetic retinopathy without macular edema, bilateral: Secondary | ICD-10-CM | POA: Diagnosis not present

## 2016-12-03 DIAGNOSIS — H33002 Unspecified retinal detachment with retinal break, left eye: Secondary | ICD-10-CM | POA: Diagnosis not present

## 2016-12-03 DIAGNOSIS — H4312 Vitreous hemorrhage, left eye: Secondary | ICD-10-CM | POA: Diagnosis not present

## 2017-01-14 ENCOUNTER — Emergency Department (HOSPITAL_COMMUNITY): Payer: BLUE CROSS/BLUE SHIELD

## 2017-01-14 ENCOUNTER — Encounter (HOSPITAL_COMMUNITY): Payer: Self-pay | Admitting: Nurse Practitioner

## 2017-01-14 ENCOUNTER — Emergency Department (HOSPITAL_COMMUNITY)
Admission: EM | Admit: 2017-01-14 | Discharge: 2017-01-14 | Disposition: A | Discharge status: Home / Self Care | Payer: BLUE CROSS/BLUE SHIELD | Attending: Emergency Medicine | Admitting: Emergency Medicine

## 2017-01-14 DIAGNOSIS — Z794 Long term (current) use of insulin: Secondary | ICD-10-CM | POA: Diagnosis not present

## 2017-01-14 DIAGNOSIS — R1013 Epigastric pain: Secondary | ICD-10-CM | POA: Diagnosis not present

## 2017-01-14 DIAGNOSIS — Z8711 Personal history of peptic ulcer disease: Secondary | ICD-10-CM | POA: Diagnosis not present

## 2017-01-14 DIAGNOSIS — R109 Unspecified abdominal pain: Secondary | ICD-10-CM | POA: Diagnosis not present

## 2017-01-14 DIAGNOSIS — E109 Type 1 diabetes mellitus without complications: Secondary | ICD-10-CM | POA: Insufficient documentation

## 2017-01-14 DIAGNOSIS — K529 Noninfective gastroenteritis and colitis, unspecified: Secondary | ICD-10-CM | POA: Diagnosis not present

## 2017-01-14 HISTORY — DX: Gastrojejunal ulcer, unspecified as acute or chronic, without hemorrhage or perforation: K28.9

## 2017-01-14 LAB — COMPREHENSIVE METABOLIC PANEL
ALT: 17 U/L (ref 14–54)
ANION GAP: 10 (ref 5–15)
AST: 19 U/L (ref 15–41)
Albumin: 4 g/dL (ref 3.5–5.0)
Alkaline Phosphatase: 60 U/L (ref 38–126)
BUN: 8 mg/dL (ref 6–20)
CHLORIDE: 94 mmol/L — AB (ref 101–111)
CO2: 26 mmol/L (ref 22–32)
Calcium: 9.3 mg/dL (ref 8.9–10.3)
Creatinine, Ser: 0.93 mg/dL (ref 0.44–1.00)
GFR calc Af Amer: >60 mL/min (ref >60)
GFR calc non Af Amer: >60 mL/min (ref >60)
GLUCOSE: 208 mg/dL — AB (ref 65–99)
Potassium: 3.4 mmol/L — ABNORMAL LOW (ref 3.5–5.1)
SODIUM: 130 mmol/L — AB (ref 135–145)
TOTAL PROTEIN: 6.6 g/dL (ref 6.5–8.1)
Total Bilirubin: 0.8 mg/dL (ref 0.3–1.2)

## 2017-01-14 LAB — URINALYSIS, ROUTINE W REFLEX MICROSCOPIC
BACTERIA UA: NONE SEEN
Bilirubin Urine: NEGATIVE
Glucose, UA: NEGATIVE mg/dL
KETONES UR: 5 mg/dL — AB
Leukocytes, UA: NEGATIVE
NITRITE: NEGATIVE
Protein, ur: NEGATIVE mg/dL
SPECIFIC GRAVITY, URINE: 1.004 — AB (ref 1.005–1.030)
pH: 6 (ref 5.0–8.0)

## 2017-01-14 LAB — CBC
HEMATOCRIT: 35.8 % — AB (ref 36.0–46.0)
HEMOGLOBIN: 12.1 g/dL (ref 12.0–15.0)
MCH: 28.4 pg (ref 26.0–34.0)
MCHC: 33.8 g/dL (ref 30.0–36.0)
MCV: 84 fL (ref 78.0–100.0)
Platelets: 218 10*3/uL (ref 150–400)
RBC: 4.26 MIL/uL (ref 3.87–5.11)
RDW: 13.3 % (ref 11.5–15.5)
WBC: 8.5 10*3/uL (ref 4.0–10.5)

## 2017-01-14 LAB — LIPASE, BLOOD: LIPASE: 21 U/L (ref 11–51)

## 2017-01-14 MED ORDER — IOPAMIDOL (ISOVUE-300) INJECTION 61%
INTRAVENOUS | Status: AC
  Administered 2017-01-14: 100 mL
  Filled 2017-01-14: qty 100

## 2017-01-14 MED ORDER — SODIUM CHLORIDE 0.9 % IV BOLUS (SEPSIS)
1000.0000 mL | Freq: Once | INTRAVENOUS | Status: AC
  Administered 2017-01-14: 1000 mL via INTRAVENOUS

## 2017-01-14 MED ORDER — ALUMINUM-MAGNESIUM-SIMETHICONE 200-200-20 MG/5ML PO SUSP: 30.0000 mL | ORAL | 0 refills | Status: DC | PRN

## 2017-01-14 MED ORDER — GI COCKTAIL ~~LOC~~
30.0000 mL | Freq: Once | ORAL | Status: AC
  Administered 2017-01-14: 30 mL via ORAL
  Filled 2017-01-14: qty 30

## 2017-01-14 NOTE — ED Provider Notes (Signed)
[facility]-EM[facility]ENCY DEPT Provider Note   CSN: [id number] Arrival date & time: 01/14/17  1630     History   Chief Complaint Chief Complaint  Patient presents with  . Abdominal Pain    HPI [name] is a 61 y.o. female.  Patient is a 61 year old female who is a type I diabetic with a prior history of stomach ulcers and perforation approximately 10-15 years ago who presents today with waxing and waning epigastric abdominal pain that has been occurring for months but worsening over the last week she has had multiple episodes on Monday Wednesday and then today. Patient has been on a clear diet today because she was getting ready for a colonoscopy tomorrow but this pain seemed to be worse and she was worried about taking medication to increase her stool with the discomfort she was having. She has nausea with it but denies any vomiting. She does not take any NSAIDs and does not drink alcohol. She is still taking a PPI. She denies any dark stools.   The history is provided by the patient.  Abdominal Pain   This is a recurrent problem. Episode onset: more prominent this week but has been bothering her intermittently for months. The problem occurs hourly. The problem has been gradually worsening. Associated with: usually seemed to be worse in the evening after her largest meal of the day. The pain is located in the epigastric region. The pain is at a severity of 3/10. The pain is moderate. Associated symptoms include nausea. Pertinent negatives include anorexia, fever, diarrhea, vomiting, dysuria and myalgias. Nothing aggravates the symptoms. Nothing relieves the symptoms. Past workup comments: was scheduled for colonoscopy and endoscopy tomorrow but symptoms were worsening.. Her past medical history is significant for PUD.    Past Medical History:  Diagnosis Date  . Diabetes mellitus without complication (HCC)   . Neuropathy   . Ulcer of the stomach and intestine     There are no active  problems to display for this patient.   Past Surgical History:  Procedure Laterality Date  . ABDOMINAL HYSTERECTOMY    . FRACTURE SURGERY      OB History    No data available       Home Medications    Prior to Admission medications   Medication Sig Start Date End Date Taking? Authorizing Provider  calcium carbonate (TUMS - DOSED IN MG ELEMENTAL CALCIUM) 500 MG chewable tablet Chew 1 tablet by mouth daily as needed for heartburn.    [provider]  ergocalciferol (VITAMIN D2) 50000 UNITS capsule Take 50,000 Units by mouth once a week. Wednesday    [provider]  insulin aspart (NOVOLOG) 100 UNIT/ML injection Inject 6 Units into the skin 3 (three) times daily before meals. Pt uses sliding scale    [provider]  insulin glargine (LANTUS) 100 UNIT/ML injection Inject 8 Units into the skin at bedtime.    [provider]  Omeprazole Magnesium (PRILOSEC OTC PO) Take 2 tablets by mouth daily.    [provider]  quinapril (ACCUPRIL) 40 MG tablet Take 40 mg by mouth at bedtime.    [provider]  sucralfate (CARAFATE) 1 G tablet Take 1 tablet (1 g total) by mouth 4 (four) times daily. 10/17/12   Lorre NickAllen, Anthony, MD    Family History No family history on file.  Social History Social History  Substance Use Topics  . Smoking status: Never Smoker  . Smokeless tobacco: Never Used  . Alcohol use No  Allergies   Cephalosporins; Garlic; Macrobid [nitrofurantoin]; Onion; and Sulfa antibiotics   Review of Systems Review of Systems  Constitutional: Negative for fever.  Gastrointestinal: Positive for abdominal pain and nausea. Negative for anorexia, diarrhea and vomiting.  Genitourinary: Negative for dysuria.  Musculoskeletal: Negative for myalgias.  All other systems reviewed and are negative.    Physical Exam Updated Vital Signs BP (!) 149/69 (BP Location: Right Arm)   Pulse (!) 112   Temp 99 F (37.2 C) (Oral)    Resp 20   SpO2 99%   Physical Exam  Constitutional: She is oriented to person, place, and time. She appears well-developed and well-nourished. No distress.  HENT:  Head: Normocephalic and atraumatic.  Mouth/Throat: Oropharynx is clear and moist.  Eyes: Conjunctivae and EOM are normal. Pupils are equal, round, and reactive to light.  Neck: Normal range of motion. Neck supple.  Cardiovascular: Regular rhythm and intact distal pulses.  Tachycardia present.   Murmur heard. Pulmonary/Chest: Effort normal and breath sounds normal. No respiratory distress. She has no wheezes. She has no rales.  Abdominal: Soft. She exhibits no distension. There is tenderness in the epigastric area. There is no rebound, no guarding and negative Murphy's sign.  Minimal periumbilical tenderness  Musculoskeletal: Normal range of motion. She exhibits no edema or tenderness.  Neurological: She is alert and oriented to person, place, and time.  Skin: Skin is warm and dry. No rash noted. No erythema.  Psychiatric: She has a normal mood and affect. Her behavior is normal.  Nursing note and vitals reviewed.    ED Treatments / Results  Labs (all labs ordered are listed, but only abnormal results are displayed) Labs Reviewed  COMPREHENSIVE METABOLIC PANEL - Abnormal; Notable for the following:       Result Value   Sodium 130 (*)    Potassium 3.4 (*)    Chloride 94 (*)    Glucose, Bld 208 (*)    All other components within normal limits  CBC - Abnormal; Notable for the following:    HCT 35.8 (*)    All other components within normal limits  URINALYSIS, ROUTINE W REFLEX MICROSCOPIC  LIPASE, BLOOD    EKG  EKG Interpretation None       Radiology Ct Abdomen Pelvis W Contrast  Result Date: 01/14/2017 CLINICAL DATA:  61 year old female with abdominal pain for 3 days. EXAM: CT ABDOMEN AND PELVIS WITH CONTRAST TECHNIQUE: Multidetector CT imaging of the abdomen and pelvis was performed using the standard  protocol following bolus administration of intravenous contrast. CONTRAST:  ISOVUE-300 IOPAMIDOL (ISOVUE-300) INJECTION 61% COMPARISON:  CT Abdomen and Pelvis 07/07/2010 and earlier. FINDINGS: Lower chest: No pericardial or pleural effusion. Increased lower lobe scarring or atelectasis compared 2011. Hepatobiliary: Negative liver and gallbladder. Pancreas: Negative. Spleen: Negative. Adrenals/Urinary Tract: Normal adrenal glands. Renal artery calcified atherosclerosis. Bilateral renal enhancement and contrast excretion is normal. Moderately distended but otherwise normal urinary bladder. Stomach/Bowel: Decompressed rectum and sigmoid colon with mild redundancy. Negative left colon. Mildly redundant splenic flexure and transverse colon. Right colon and cecum appear within normal limits. Appendix not identified. Distal small bowel loops appear within normal limits. There is a mildly thick walled nondilated small bowel loop in the left abdomen seen on series 3, image 42. Mild associated mesenteric stranding. Less pronounced small bowel wall thickening elsewhere in the region. Decompressed stomach. Duodenum is within normal limits. No abdominal free fluid or free air. Vascular/Lymphatic: Calcified aortic atherosclerosis. Calcified atherosclerosis of aortic branches and bilateral iliofemoral  arteries. Major arterial structures in the abdomen and pelvis appear patent. No SMA branch occlusion is evident. Portal venous system appears patent. No lymphadenopathy. Reproductive: Surgically absent. Other: No pelvic free fluid. Musculoskeletal: No acute osseous abnormality identified. IMPRESSION: 1. Left abdominal mild small bowel wall thickening with adjacent mesenteric stranding is nonspecific. Top differential considerations include infectious enteritis and mild ischemic bowel (see #2). No free fluid. No evidence of bowel obstruction. 2. Calcified aortic atherosclerosis with fairly extensive SMA calcified plaque (see  series 3, image 25). No arterial occlusion is identified in the abdomen or pelvis. Electronically Signed   By: Odessa Fleming M.D.   On: 01/14/2017 21:13    Procedures Procedures (including critical care time)  Medications Ordered in ED Medications  sodium chloride 0.9 % bolus 1,000 mL (not administered)  gi cocktail (Maalox,Lidocaine,Donnatal) (not administered)     Initial Impression / Assessment and Plan / ED Course  I have reviewed the triage vital signs and the nursing notes.  Pertinent labs & imaging results that were available during my care of the patient were reviewed by me and considered in my medical decision making (see chart for details).    Patient presenting with abdominal discomfort which has been intermittent over several months but worse in the last week. It is in the periumbilical epigastric area and seems to be worse today. She has associated nausea but no vomiting or stool changes. Patient does have a significant history for perforated stomach ulcer years ago but nothing recent. No other abdominal surgeries except for hysterectomy. Patient is well appearing on exam but is tachycardic. She denies any fever. He thinks the pain might be worse after eating but cannot correlate it to eating today. Pain is only 3 out of 10 at this time. Concern for possible peptic ulcer disease with lower suspicion for perforation, also cholelithiasis versus diabetic gastroparesis versus other intestinal pathology. Low suspicion for diverticulitis or appendicitis. Labs are reassuring with a normal CBC. CMP with mild hyperglycemia today up to a late but patient was told not to take her insulin but has still taken sliding scale today when her sugar has been elevated. Lipase is within normal limits. No evidence of DKA with a normal gap. Patient given IV fluids and CT of the abdomen and pelvis pending.  She was given GI cocktail.  10:00 PM Patient improved after GI cocktail. CT showed mild small bowel  enteritis with mesenteric stranding. She also has narrowing of her SMA. However no occlusion of the SMA. However concern for possible chronic SMA ischemia given length of the patient having symptoms and gradually worsening. However could also still be ulcer disease. Lower suspicion for infectious etiology. Discussed findings with patient's and recommended follow-up with PCP. Heart rate improved with fluids and patient is well-appearing. She was given strict return precautions and she and her sister comfortable with this plan.  Final Clinical Impressions(s) / ED Diagnoses   Final diagnoses:  Enteritis    New Prescriptions New Prescriptions   ALUMINUM-MAGNESIUM HYDROXIDE-SIMETHICONE (MAALOX) 200-200-20 MG/5ML SUSP    Take 30 mLs by mouth as needed. Can take up to 3 times a day     Gwyneth Sprout, MD 01/14/17 2201

## 2017-01-14 NOTE — ED Triage Notes (Signed)
Pt presents with c/o abdominal pain. The pain has been intermittent since Monday. She reports fevers, nausea. She denies vomiting, dysuria, hematuria, flank pain, diarrhea, constipation. The pain became worse last night after eating a fried chicken sandwich. She is scheduled for her routine 10 year colonoscopy tomorrow and has been on a clear liquid diet today.

## 2017-01-14 NOTE — ED Notes (Signed)
Patient transported to CT 

## 2017-01-17 DIAGNOSIS — H33002 Unspecified retinal detachment with retinal break, left eye: Secondary | ICD-10-CM | POA: Diagnosis not present

## 2017-01-17 DIAGNOSIS — H35352 Cystoid macular degeneration, left eye: Secondary | ICD-10-CM | POA: Diagnosis not present

## 2017-01-17 DIAGNOSIS — H35371 Puckering of macula, right eye: Secondary | ICD-10-CM | POA: Diagnosis not present

## 2017-01-17 DIAGNOSIS — E113593 Type 2 diabetes mellitus with proliferative diabetic retinopathy without macular edema, bilateral: Secondary | ICD-10-CM | POA: Diagnosis not present

## 2017-01-17 DIAGNOSIS — H35353 Cystoid macular degeneration, bilateral: Secondary | ICD-10-CM | POA: Diagnosis not present

## 2017-01-18 ENCOUNTER — Other Ambulatory Visit: Payer: Self-pay | Admitting: Gastroenterology

## 2017-01-18 DIAGNOSIS — Z01818 Encounter for other preprocedural examination: Secondary | ICD-10-CM | POA: Diagnosis not present

## 2017-01-18 DIAGNOSIS — R1033 Periumbilical pain: Secondary | ICD-10-CM

## 2017-01-21 DIAGNOSIS — H33002 Unspecified retinal detachment with retinal break, left eye: Secondary | ICD-10-CM | POA: Diagnosis not present

## 2017-01-21 DIAGNOSIS — H40003 Preglaucoma, unspecified, bilateral: Secondary | ICD-10-CM | POA: Diagnosis not present

## 2017-01-21 DIAGNOSIS — E113593 Type 2 diabetes mellitus with proliferative diabetic retinopathy without macular edema, bilateral: Secondary | ICD-10-CM | POA: Diagnosis not present

## 2017-01-21 DIAGNOSIS — H35352 Cystoid macular degeneration, left eye: Secondary | ICD-10-CM | POA: Diagnosis not present

## 2017-01-27 ENCOUNTER — Ambulatory Visit
Admission: RE | Admit: 2017-01-27 | Discharge: 2017-01-27 | Disposition: A | Discharge status: Home / Self Care | Payer: BLUE CROSS/BLUE SHIELD | Source: Ambulatory Visit | Attending: Gastroenterology | Admitting: Gastroenterology

## 2017-01-27 DIAGNOSIS — R1013 Epigastric pain: Secondary | ICD-10-CM | POA: Diagnosis not present

## 2017-01-27 DIAGNOSIS — R1033 Periumbilical pain: Secondary | ICD-10-CM

## 2017-01-29 DIAGNOSIS — R1013 Epigastric pain: Secondary | ICD-10-CM | POA: Diagnosis not present

## 2017-01-29 DIAGNOSIS — K317 Polyp of stomach and duodenum: Secondary | ICD-10-CM | POA: Diagnosis not present

## 2017-01-29 DIAGNOSIS — Z1211 Encounter for screening for malignant neoplasm of colon: Secondary | ICD-10-CM | POA: Diagnosis not present

## 2017-01-29 DIAGNOSIS — K2930 Chronic superficial gastritis without bleeding: Secondary | ICD-10-CM | POA: Diagnosis not present

## 2017-02-04 DIAGNOSIS — K317 Polyp of stomach and duodenum: Secondary | ICD-10-CM | POA: Diagnosis not present

## 2017-02-04 DIAGNOSIS — K2930 Chronic superficial gastritis without bleeding: Secondary | ICD-10-CM | POA: Diagnosis not present

## 2017-02-09 DIAGNOSIS — H2512 Age-related nuclear cataract, left eye: Secondary | ICD-10-CM | POA: Diagnosis not present

## 2017-02-09 DIAGNOSIS — H4311 Vitreous hemorrhage, right eye: Secondary | ICD-10-CM | POA: Diagnosis not present

## 2017-02-09 DIAGNOSIS — H33002 Unspecified retinal detachment with retinal break, left eye: Secondary | ICD-10-CM | POA: Diagnosis not present

## 2017-02-22 DIAGNOSIS — H40023 Open angle with borderline findings, high risk, bilateral: Secondary | ICD-10-CM | POA: Diagnosis not present

## 2017-03-04 DIAGNOSIS — E113593 Type 2 diabetes mellitus with proliferative diabetic retinopathy without macular edema, bilateral: Secondary | ICD-10-CM | POA: Diagnosis not present

## 2017-03-04 DIAGNOSIS — H40003 Preglaucoma, unspecified, bilateral: Secondary | ICD-10-CM | POA: Diagnosis not present

## 2017-03-04 DIAGNOSIS — H33002 Unspecified retinal detachment with retinal break, left eye: Secondary | ICD-10-CM | POA: Diagnosis not present

## 2017-03-04 DIAGNOSIS — H35352 Cystoid macular degeneration, left eye: Secondary | ICD-10-CM | POA: Diagnosis not present

## 2017-03-08 DIAGNOSIS — Z8711 Personal history of peptic ulcer disease: Secondary | ICD-10-CM | POA: Diagnosis not present

## 2017-03-08 DIAGNOSIS — N736 Female pelvic peritoneal adhesions (postinfective): Secondary | ICD-10-CM | POA: Diagnosis not present

## 2017-03-29 DIAGNOSIS — H40023 Open angle with borderline findings, high risk, bilateral: Secondary | ICD-10-CM | POA: Diagnosis not present

## 2017-04-15 DIAGNOSIS — H4311 Vitreous hemorrhage, right eye: Secondary | ICD-10-CM | POA: Diagnosis not present

## 2017-04-15 DIAGNOSIS — H35352 Cystoid macular degeneration, left eye: Secondary | ICD-10-CM | POA: Diagnosis not present

## 2017-04-15 DIAGNOSIS — E113593 Type 2 diabetes mellitus with proliferative diabetic retinopathy without macular edema, bilateral: Secondary | ICD-10-CM | POA: Diagnosis not present

## 2017-04-15 DIAGNOSIS — H33002 Unspecified retinal detachment with retinal break, left eye: Secondary | ICD-10-CM | POA: Diagnosis not present

## 2017-04-27 DIAGNOSIS — Z1389 Encounter for screening for other disorder: Secondary | ICD-10-CM | POA: Diagnosis not present

## 2017-04-27 DIAGNOSIS — I10 Essential (primary) hypertension: Secondary | ICD-10-CM | POA: Diagnosis not present

## 2017-04-27 DIAGNOSIS — E559 Vitamin D deficiency, unspecified: Secondary | ICD-10-CM | POA: Diagnosis not present

## 2017-04-27 DIAGNOSIS — E113599 Type 2 diabetes mellitus with proliferative diabetic retinopathy without macular edema, unspecified eye: Secondary | ICD-10-CM | POA: Diagnosis not present

## 2017-04-27 DIAGNOSIS — E10319 Type 1 diabetes mellitus with unspecified diabetic retinopathy without macular edema: Secondary | ICD-10-CM | POA: Diagnosis not present

## 2017-05-20 DIAGNOSIS — E113593 Type 2 diabetes mellitus with proliferative diabetic retinopathy without macular edema, bilateral: Secondary | ICD-10-CM | POA: Diagnosis not present

## 2017-05-20 DIAGNOSIS — H4311 Vitreous hemorrhage, right eye: Secondary | ICD-10-CM | POA: Diagnosis not present

## 2017-05-20 DIAGNOSIS — H33002 Unspecified retinal detachment with retinal break, left eye: Secondary | ICD-10-CM | POA: Diagnosis not present

## 2017-05-20 DIAGNOSIS — H35352 Cystoid macular degeneration, left eye: Secondary | ICD-10-CM | POA: Diagnosis not present

## 2017-07-06 DIAGNOSIS — I498 Other specified cardiac arrhythmias: Secondary | ICD-10-CM | POA: Diagnosis not present

## 2017-07-06 DIAGNOSIS — R002 Palpitations: Secondary | ICD-10-CM | POA: Diagnosis not present

## 2017-07-06 DIAGNOSIS — I10 Essential (primary) hypertension: Secondary | ICD-10-CM | POA: Diagnosis not present

## 2017-07-06 DIAGNOSIS — E10319 Type 1 diabetes mellitus with unspecified diabetic retinopathy without macular edema: Secondary | ICD-10-CM | POA: Diagnosis not present

## 2017-08-26 DIAGNOSIS — H4311 Vitreous hemorrhage, right eye: Secondary | ICD-10-CM | POA: Diagnosis not present

## 2017-08-26 DIAGNOSIS — H35352 Cystoid macular degeneration, left eye: Secondary | ICD-10-CM | POA: Diagnosis not present

## 2017-08-26 DIAGNOSIS — H33002 Unspecified retinal detachment with retinal break, left eye: Secondary | ICD-10-CM | POA: Diagnosis not present

## 2017-08-26 DIAGNOSIS — E113593 Type 2 diabetes mellitus with proliferative diabetic retinopathy without macular edema, bilateral: Secondary | ICD-10-CM | POA: Diagnosis not present

## 2017-10-04 DIAGNOSIS — H40023 Open angle with borderline findings, high risk, bilateral: Secondary | ICD-10-CM | POA: Diagnosis not present

## 2017-11-04 DIAGNOSIS — E113593 Type 2 diabetes mellitus with proliferative diabetic retinopathy without macular edema, bilateral: Secondary | ICD-10-CM | POA: Diagnosis not present

## 2017-11-04 DIAGNOSIS — H35352 Cystoid macular degeneration, left eye: Secondary | ICD-10-CM | POA: Diagnosis not present

## 2017-11-04 DIAGNOSIS — H33002 Unspecified retinal detachment with retinal break, left eye: Secondary | ICD-10-CM | POA: Diagnosis not present

## 2017-11-04 DIAGNOSIS — H4311 Vitreous hemorrhage, right eye: Secondary | ICD-10-CM | POA: Diagnosis not present

## 2017-12-02 DIAGNOSIS — E10319 Type 1 diabetes mellitus with unspecified diabetic retinopathy without macular edema: Secondary | ICD-10-CM | POA: Diagnosis not present

## 2017-12-02 DIAGNOSIS — E113599 Type 2 diabetes mellitus with proliferative diabetic retinopathy without macular edema, unspecified eye: Secondary | ICD-10-CM | POA: Diagnosis not present

## 2017-12-02 DIAGNOSIS — E559 Vitamin D deficiency, unspecified: Secondary | ICD-10-CM | POA: Diagnosis not present

## 2017-12-02 DIAGNOSIS — I6529 Occlusion and stenosis of unspecified carotid artery: Secondary | ICD-10-CM | POA: Diagnosis not present

## 2017-12-02 DIAGNOSIS — Z1389 Encounter for screening for other disorder: Secondary | ICD-10-CM | POA: Diagnosis not present

## 2017-12-24 DIAGNOSIS — L509 Urticaria, unspecified: Secondary | ICD-10-CM | POA: Diagnosis not present

## 2017-12-24 DIAGNOSIS — E10319 Type 1 diabetes mellitus with unspecified diabetic retinopathy without macular edema: Secondary | ICD-10-CM | POA: Diagnosis not present

## 2017-12-24 DIAGNOSIS — L309 Dermatitis, unspecified: Secondary | ICD-10-CM | POA: Diagnosis not present

## 2017-12-24 DIAGNOSIS — W57XXXA Bitten or stung by nonvenomous insect and other nonvenomous arthropods, initial encounter: Secondary | ICD-10-CM | POA: Diagnosis not present

## 2018-01-13 DIAGNOSIS — H33002 Unspecified retinal detachment with retinal break, left eye: Secondary | ICD-10-CM | POA: Diagnosis not present

## 2018-01-13 DIAGNOSIS — E113593 Type 2 diabetes mellitus with proliferative diabetic retinopathy without macular edema, bilateral: Secondary | ICD-10-CM | POA: Diagnosis not present

## 2018-01-13 DIAGNOSIS — H4311 Vitreous hemorrhage, right eye: Secondary | ICD-10-CM | POA: Diagnosis not present

## 2018-01-13 DIAGNOSIS — H35352 Cystoid macular degeneration, left eye: Secondary | ICD-10-CM | POA: Diagnosis not present

## 2018-01-25 ENCOUNTER — Other Ambulatory Visit (HOSPITAL_COMMUNITY): Payer: Self-pay | Admitting: Endocrinology

## 2018-01-25 DIAGNOSIS — R011 Cardiac murmur, unspecified: Secondary | ICD-10-CM

## 2018-01-25 DIAGNOSIS — R002 Palpitations: Secondary | ICD-10-CM

## 2018-02-01 ENCOUNTER — Other Ambulatory Visit: Payer: Self-pay

## 2018-02-01 ENCOUNTER — Ambulatory Visit (HOSPITAL_COMMUNITY): Payer: BLUE CROSS/BLUE SHIELD | Attending: Cardiovascular Disease

## 2018-02-01 ENCOUNTER — Encounter (INDEPENDENT_AMBULATORY_CARE_PROVIDER_SITE_OTHER): Payer: Self-pay

## 2018-02-01 DIAGNOSIS — R011 Cardiac murmur, unspecified: Secondary | ICD-10-CM | POA: Insufficient documentation

## 2018-02-01 DIAGNOSIS — R002 Palpitations: Secondary | ICD-10-CM

## 2018-02-01 DIAGNOSIS — E119 Type 2 diabetes mellitus without complications: Secondary | ICD-10-CM | POA: Insufficient documentation

## 2018-02-01 NOTE — Progress Notes (Unsigned)
[id number]  

## 2018-02-03 DIAGNOSIS — H4311 Vitreous hemorrhage, right eye: Secondary | ICD-10-CM | POA: Diagnosis not present

## 2018-02-03 DIAGNOSIS — H33002 Unspecified retinal detachment with retinal break, left eye: Secondary | ICD-10-CM | POA: Diagnosis not present

## 2018-02-03 DIAGNOSIS — E113593 Type 2 diabetes mellitus with proliferative diabetic retinopathy without macular edema, bilateral: Secondary | ICD-10-CM | POA: Diagnosis not present

## 2018-02-03 DIAGNOSIS — H35352 Cystoid macular degeneration, left eye: Secondary | ICD-10-CM | POA: Diagnosis not present

## 2018-04-08 DIAGNOSIS — H401113 Primary open-angle glaucoma, right eye, severe stage: Secondary | ICD-10-CM | POA: Diagnosis not present

## 2018-05-02 DIAGNOSIS — H401113 Primary open-angle glaucoma, right eye, severe stage: Secondary | ICD-10-CM | POA: Diagnosis not present

## 2018-05-05 DIAGNOSIS — H33002 Unspecified retinal detachment with retinal break, left eye: Secondary | ICD-10-CM | POA: Diagnosis not present

## 2018-05-05 DIAGNOSIS — E113593 Type 2 diabetes mellitus with proliferative diabetic retinopathy without macular edema, bilateral: Secondary | ICD-10-CM | POA: Diagnosis not present

## 2018-05-05 DIAGNOSIS — Z794 Long term (current) use of insulin: Secondary | ICD-10-CM | POA: Diagnosis not present

## 2018-05-05 DIAGNOSIS — H4311 Vitreous hemorrhage, right eye: Secondary | ICD-10-CM | POA: Diagnosis not present

## 2018-05-05 DIAGNOSIS — H35352 Cystoid macular degeneration, left eye: Secondary | ICD-10-CM | POA: Diagnosis not present

## 2018-05-10 DIAGNOSIS — I6529 Occlusion and stenosis of unspecified carotid artery: Secondary | ICD-10-CM | POA: Diagnosis not present

## 2018-05-10 DIAGNOSIS — E871 Hypo-osmolality and hyponatremia: Secondary | ICD-10-CM | POA: Diagnosis not present

## 2018-05-10 DIAGNOSIS — Z23 Encounter for immunization: Secondary | ICD-10-CM | POA: Diagnosis not present

## 2018-05-10 DIAGNOSIS — E10319 Type 1 diabetes mellitus with unspecified diabetic retinopathy without macular edema: Secondary | ICD-10-CM | POA: Diagnosis not present

## 2018-05-10 DIAGNOSIS — H33002 Unspecified retinal detachment with retinal break, left eye: Secondary | ICD-10-CM | POA: Diagnosis not present

## 2018-05-10 DIAGNOSIS — E559 Vitamin D deficiency, unspecified: Secondary | ICD-10-CM | POA: Diagnosis not present

## 2018-06-27 DIAGNOSIS — H401113 Primary open-angle glaucoma, right eye, severe stage: Secondary | ICD-10-CM | POA: Diagnosis not present

## 2018-07-04 DIAGNOSIS — M25571 Pain in right ankle and joints of right foot: Secondary | ICD-10-CM | POA: Diagnosis not present

## 2018-07-25 DIAGNOSIS — H401113 Primary open-angle glaucoma, right eye, severe stage: Secondary | ICD-10-CM | POA: Diagnosis not present

## 2018-08-29 IMAGING — US US ABDOMEN COMPLETE
1 series · 14 of 25 positions shown · non-contrast
Comparison: None.

CLINICAL DATA: Epigastric and mid abdominal pain.

EXAM:
ABDOMEN ULTRASOUND COMPLETE

[Series 1: us abdomen complete · 0.19mm/px · 14 of 79 slices shown]
[im 1/79]
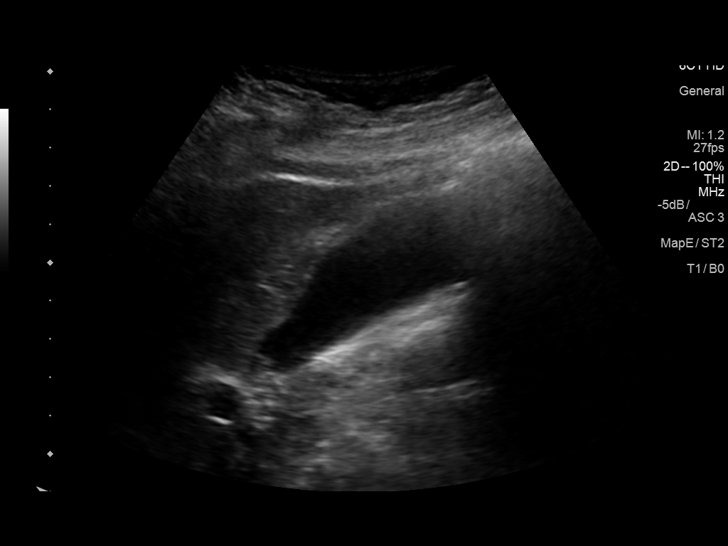
[im 7/79]
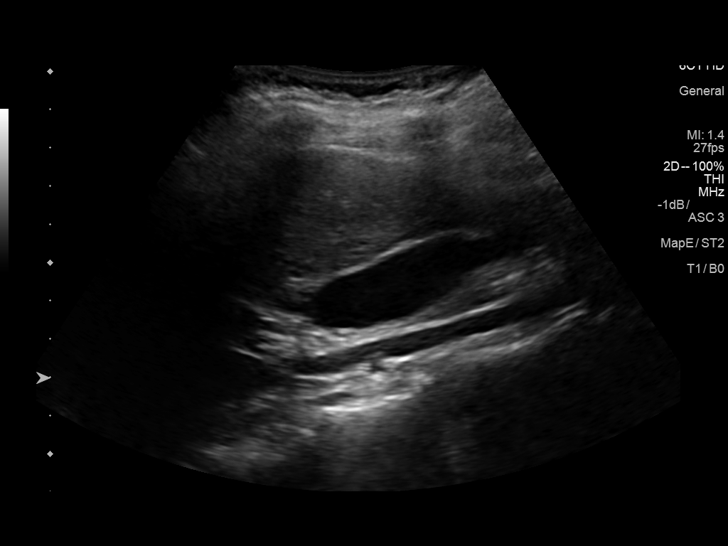
[im 14/79]
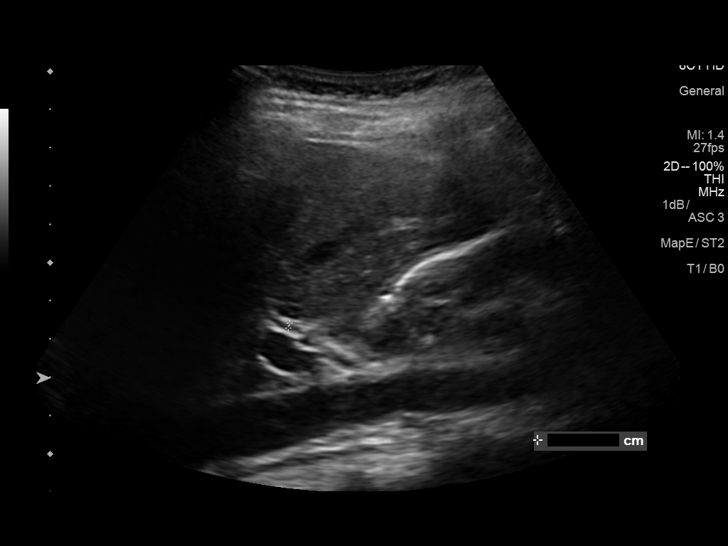
[im 20/79]
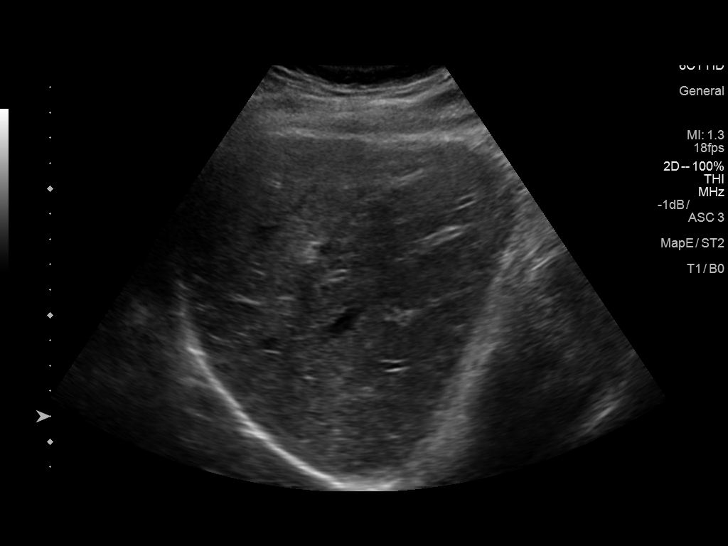
[im 27/79]
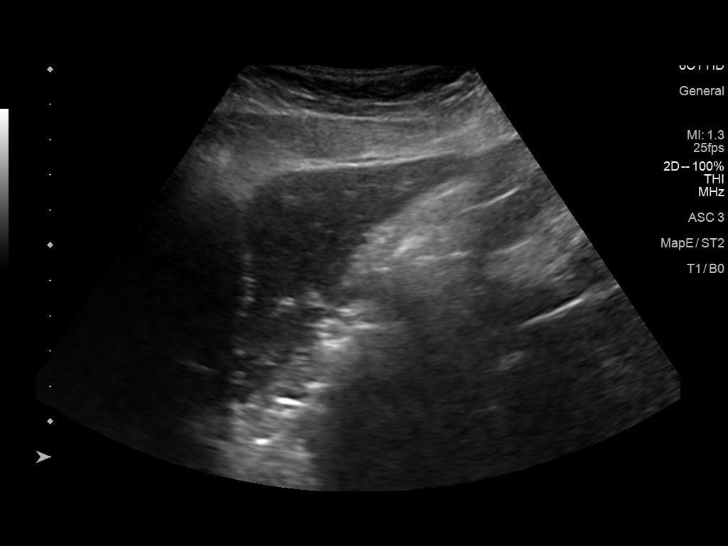
[im 30/79]
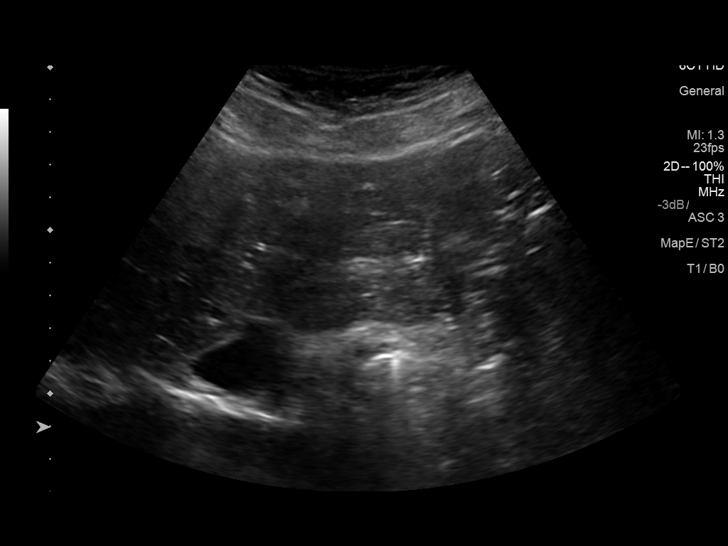
[im 36/79]
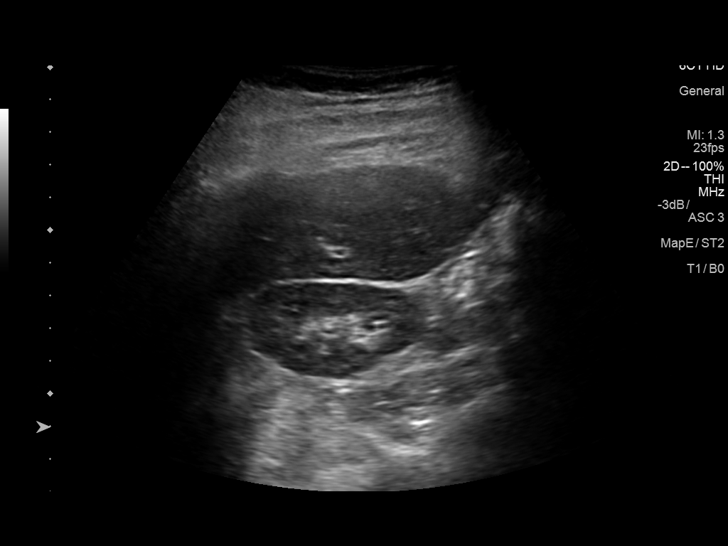
[im 43/79]
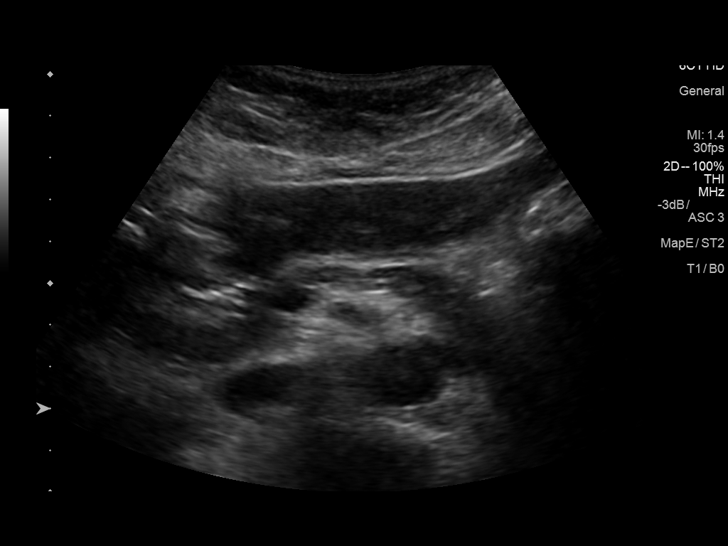
[im 49/79]
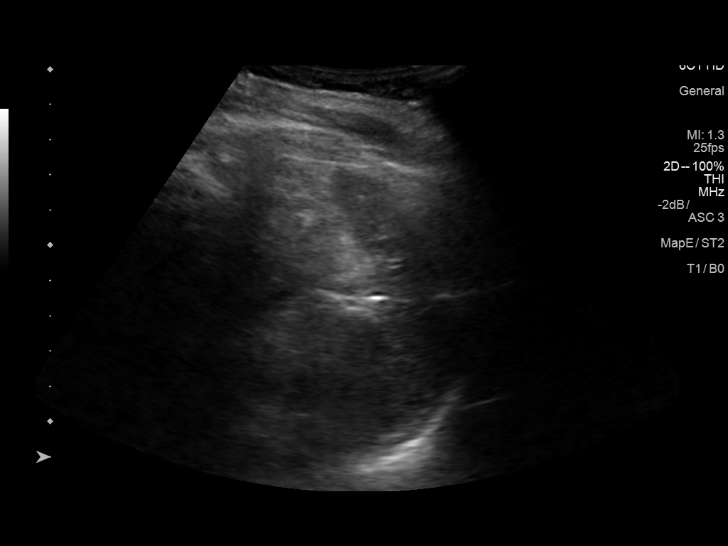
[im 53/79]
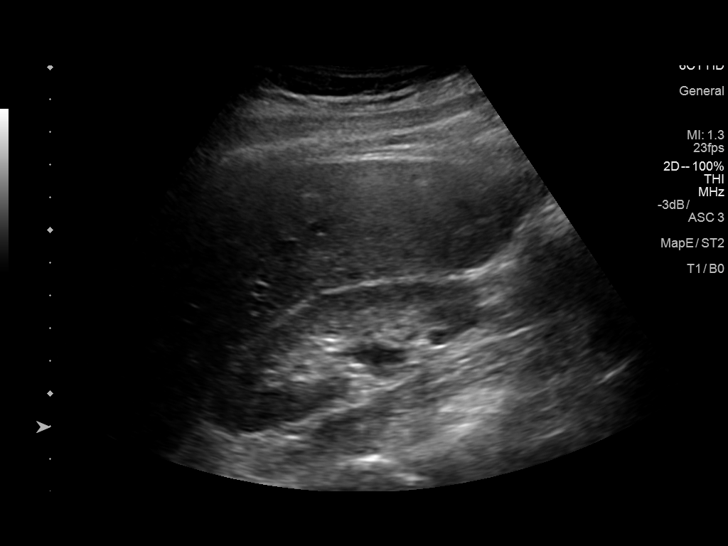
[im 59/79]
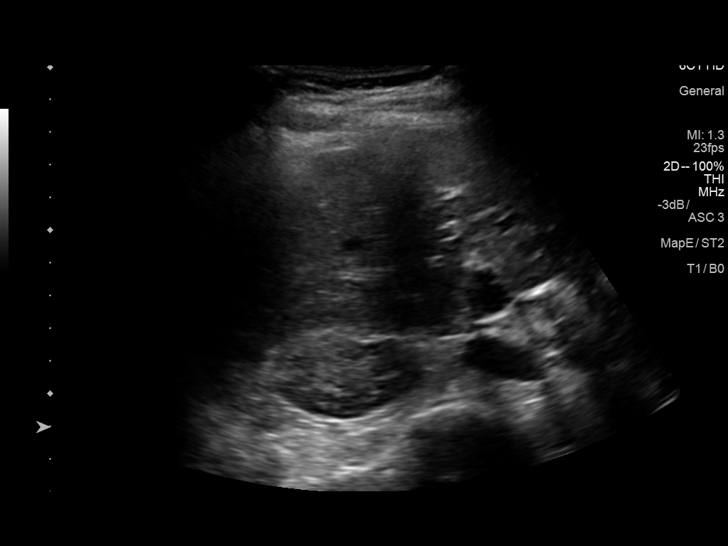
[im 66/79]
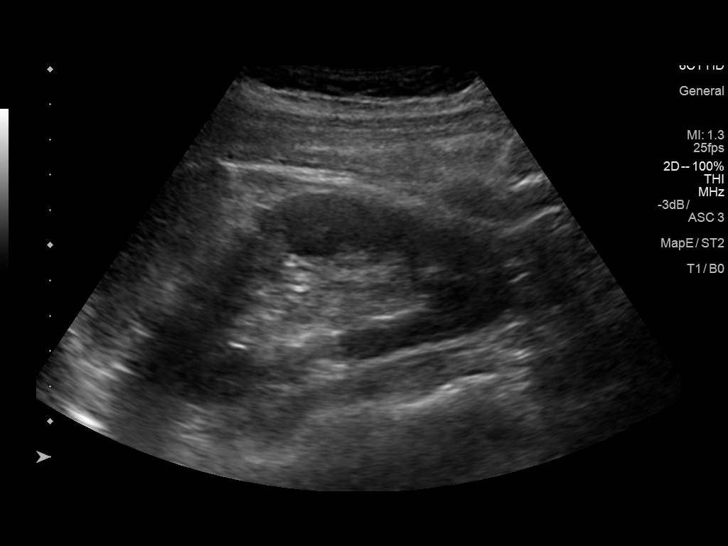
[im 72/79]
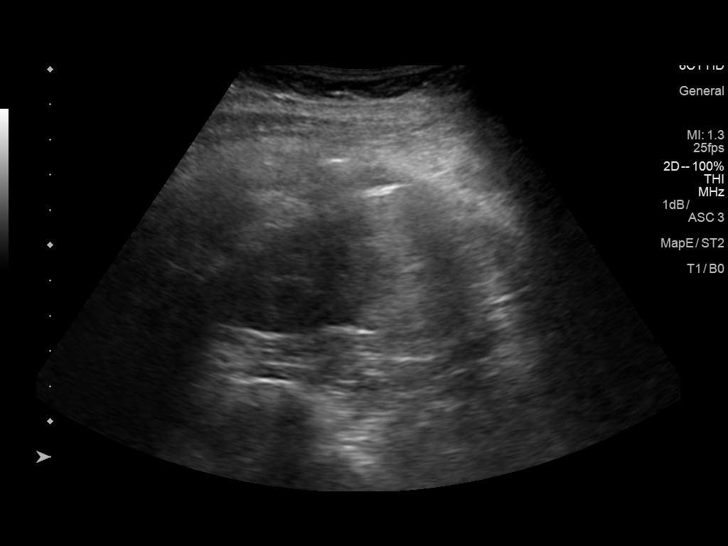
[im 79/79]
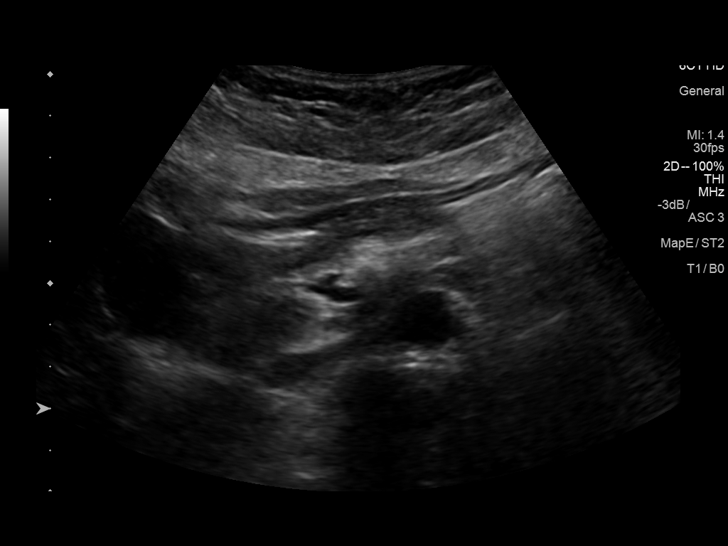

[14 of 25 positions shown; findings below may reference images not displayed]

FINDINGS: Gallbladder: No gallstones or wall thickening visualized. No
sonographic Murphy sign noted by sonographer.

Common bile duct: Diameter: 1.4 mm.

Liver: No focal lesion identified. Within normal limits in
parenchymal echogenicity. Normal hepatopetal direction of flow.

IVC: No abnormality visualized.

Pancreas: Diffuse atrophy.

Spleen: Size and appearance within normal limits.

Right Kidney: Length: 10.1 cm. Echogenicity within normal limits. No
mass or hydronephrosis visualized.

Left Kidney: Length: 11.0 cm. Echogenicity within normal limits. No
mass or hydronephrosis visualized.

Abdominal aorta: No aneurysm visualized.

Other findings: None.
IMPRESSION: Atrophic appearance of the pancreas, otherwise normal sonographic
appearance of the solid abdominal organs.

No evidence of cholelithiasis.

## 2018-10-25 IMAGING — CT CT ABD-PELV W/ CM
2 of 5 series · 16 of 46 positions shown, 18 images · IV contrast (iopamidol)
Comparison: CT Abdomen and Pelvis 07/07/2010 and earlier.

CLINICAL DATA: 60-year-old female with abdominal pain for 3 days.

EXAM:
CT ABDOMEN AND PELVIS WITH CONTRAST
TECHNIQUE: Multidetector CT imaging of the abdomen and pelvis was performed
using the standard protocol following bolus administration of
intravenous contrast.
CONTRAST:  100mL T6MTZT-NPP IOPAMIDOL (T6MTZT-NPP) INJECTION 61%

[Series 3: a/p w/ 5mm · axial · 0.79mm/px · z∈[+798,+1153]mm · 13 of 81 slices shown, 15 images]
[im 5/81  soft-tissue]
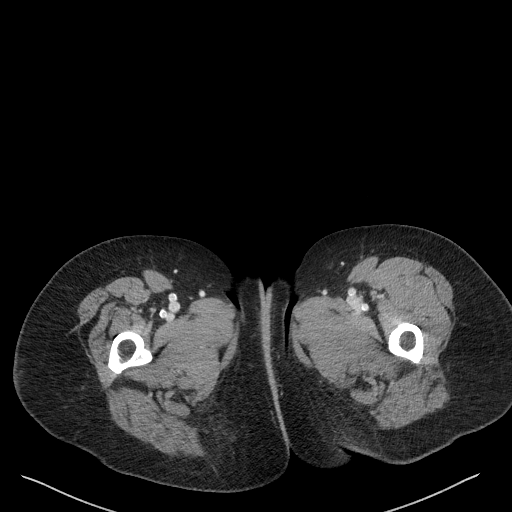
[im 5/81  bone]
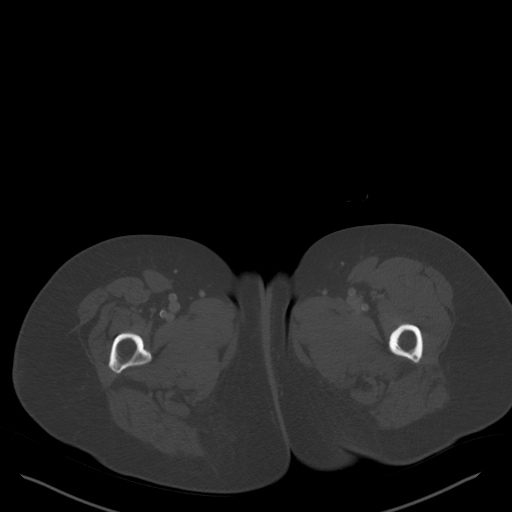
[im 13/81  soft-tissue]
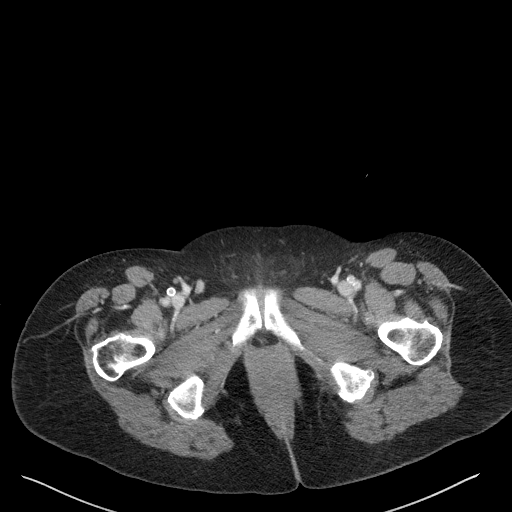
[im 17/81  soft-tissue]
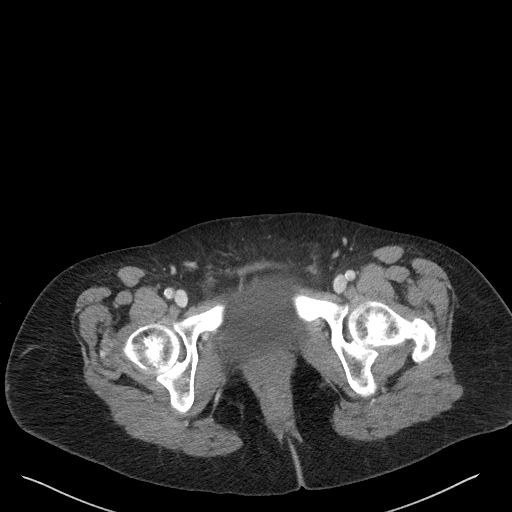
[im 22/81  soft-tissue]
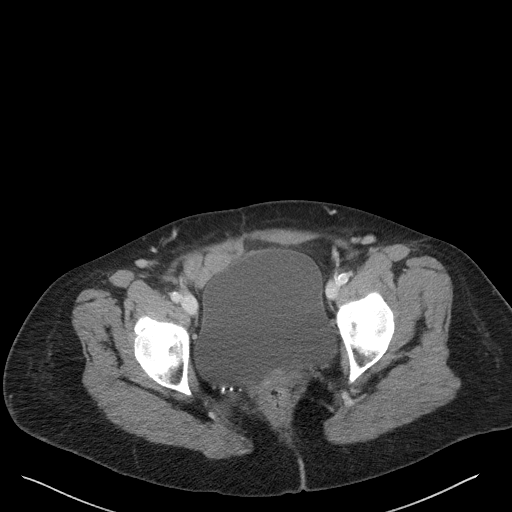
[im 30/81  soft-tissue]
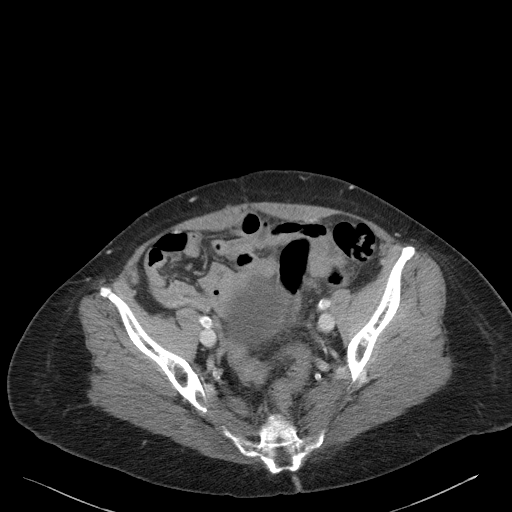
[im 34/81  soft-tissue]
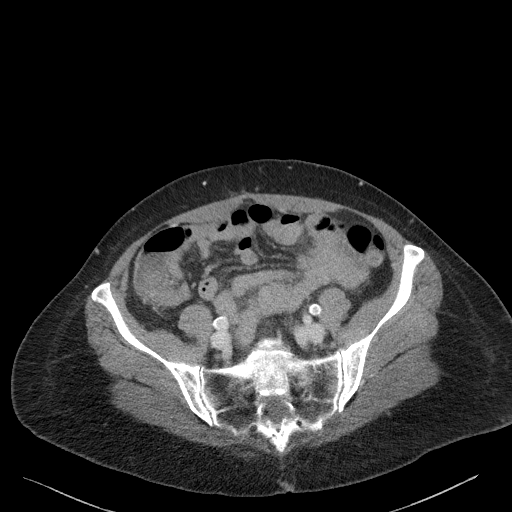
[im 43/81  soft-tissue]
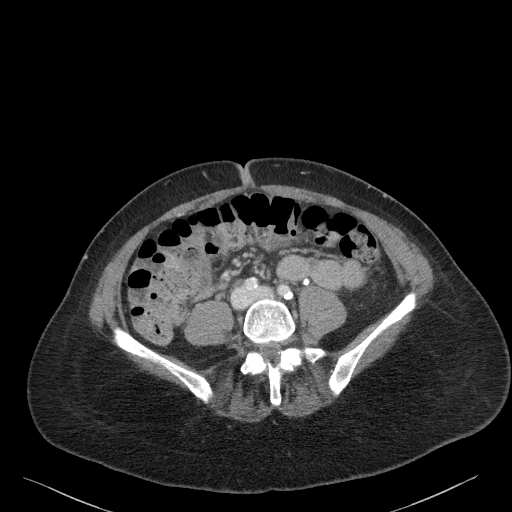
[im 47/81  soft-tissue]
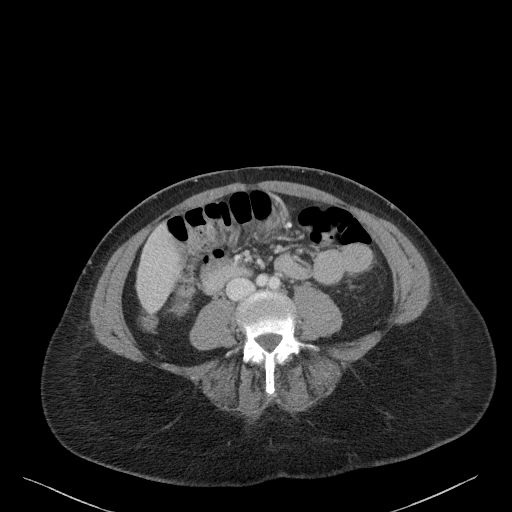
[im 51/81  soft-tissue]
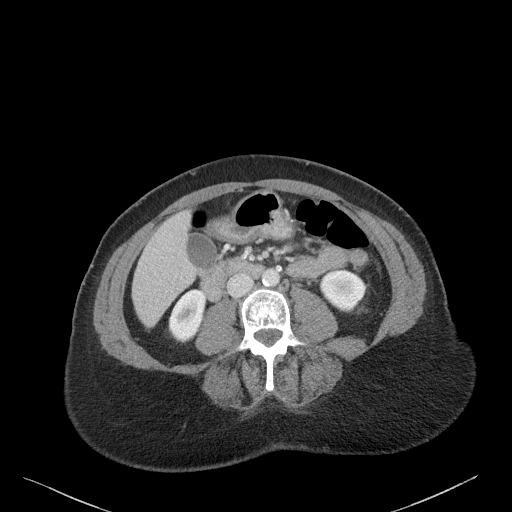
[im 51/81  bone]
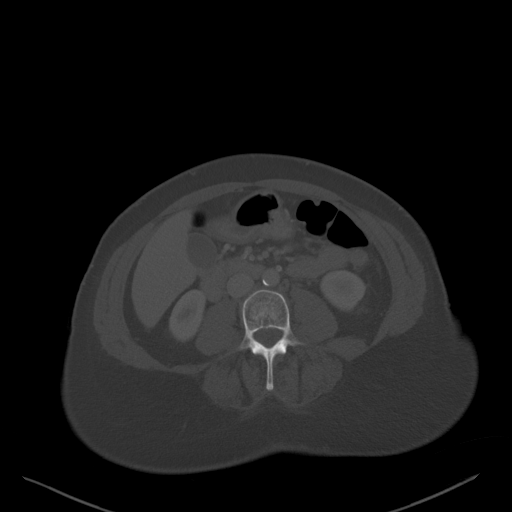
[im 59/81  soft-tissue]
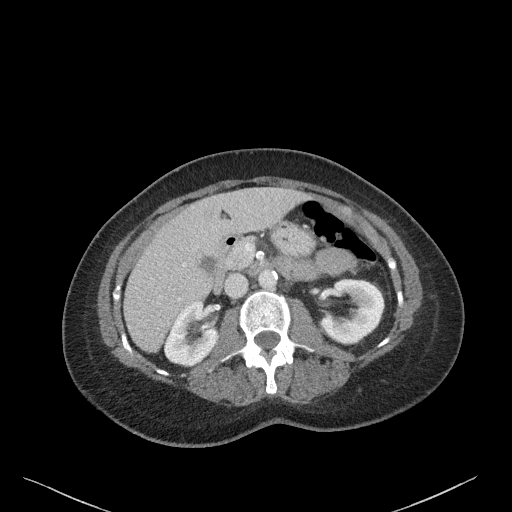
[im 64/81  soft-tissue]
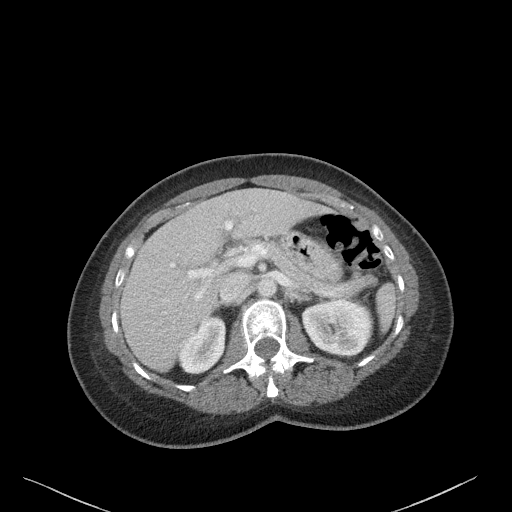
[im 68/81  soft-tissue]
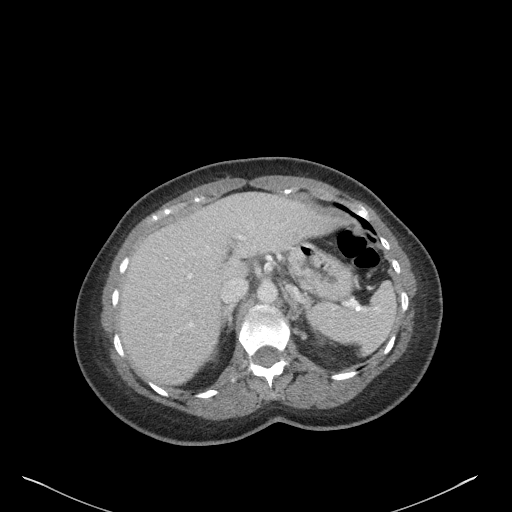
[im 76/81  soft-tissue]
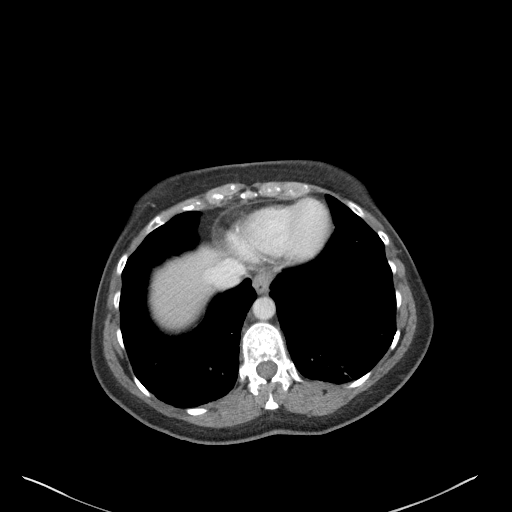

[Series 5: a/p w/ cor · coronal · 0.67mm/px · 3 of 145 slices shown]
[im 49/145  soft-tissue]
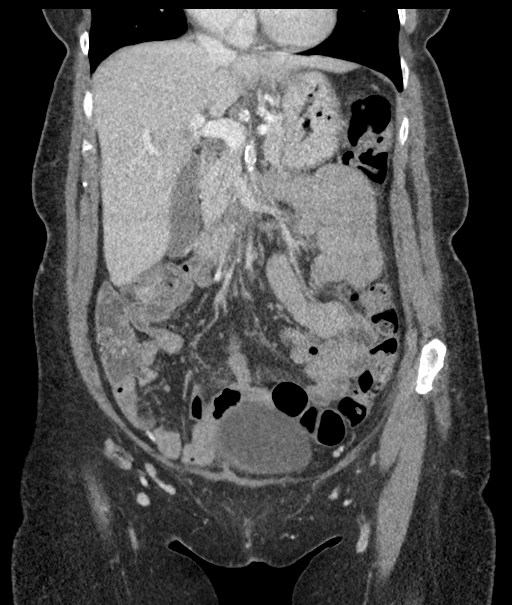
[im 65/145  soft-tissue]
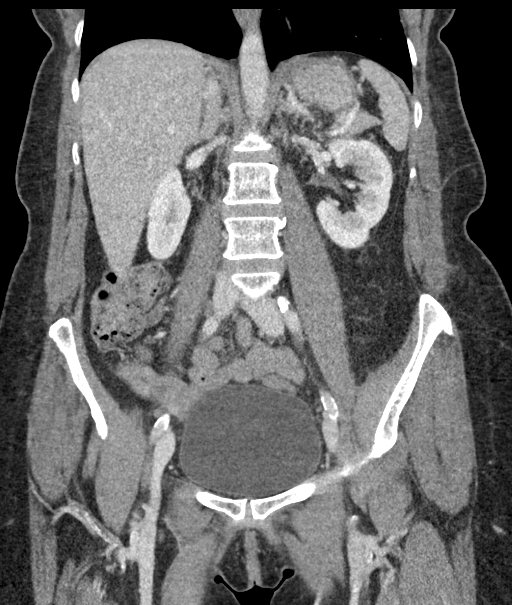
[im 81/145  soft-tissue]
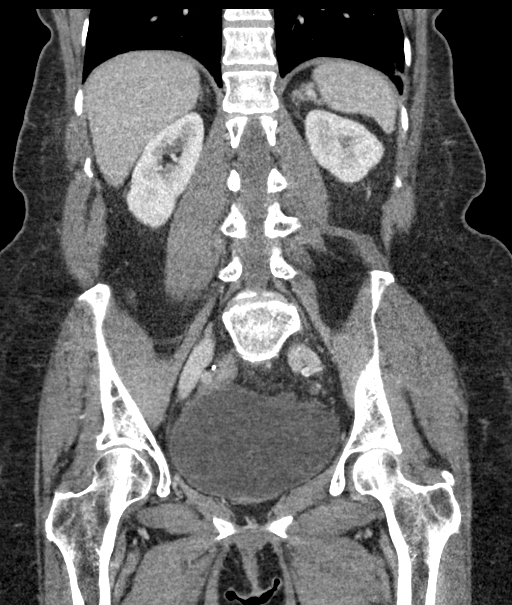

[16 of 46 positions shown; findings below may reference images not displayed]

FINDINGS: Lower chest: No pericardial or pleural effusion. Increased lower
lobe scarring or atelectasis compared 2877.

Hepatobiliary: Negative liver and gallbladder.

Pancreas: Negative.

Spleen: Negative.

Adrenals/Urinary Tract: Normal adrenal glands. Renal artery
calcified atherosclerosis. Bilateral renal enhancement and contrast
excretion is normal.

Moderately distended but otherwise normal urinary bladder.

Stomach/Bowel: Decompressed rectum and sigmoid colon with mild
redundancy. Negative left colon. Mildly redundant splenic flexure
and transverse colon. Right colon and cecum appear within normal
limits. Appendix not identified. Distal small bowel loops appear
within normal limits. There is a mildly thick walled nondilated
small bowel loop in the left abdomen seen on series 3, image 42.
Mild associated mesenteric stranding. Less pronounced small bowel
wall thickening elsewhere in the region. Decompressed stomach.
Duodenum is within normal limits.

No abdominal free fluid or free air.

Vascular/Lymphatic: Calcified aortic atherosclerosis. Calcified
atherosclerosis of aortic branches and bilateral iliofemoral
arteries. Major arterial structures in the abdomen and pelvis appear
patent. No SMA branch occlusion is evident. Portal venous system
appears patent.

No lymphadenopathy.

Reproductive: Surgically absent.

Other: No pelvic free fluid.

Musculoskeletal: No acute osseous abnormality identified.
IMPRESSION: 1. Left abdominal mild small bowel wall thickening with adjacent
mesenteric stranding is nonspecific. Top differential considerations
include infectious enteritis and mild ischemic bowel (see #2). No
free fluid. No evidence of bowel obstruction.
2. Calcified aortic atherosclerosis with fairly extensive SMA
calcified plaque (see series 3, image 25). No arterial occlusion is
identified in the abdomen or pelvis.

## 2018-11-17 DIAGNOSIS — S72001A Fracture of unspecified part of neck of right femur, initial encounter for closed fracture: Secondary | ICD-10-CM | POA: Diagnosis not present

## 2018-11-18 DIAGNOSIS — S72001D Fracture of unspecified part of neck of right femur, subsequent encounter for closed fracture with routine healing: Secondary | ICD-10-CM | POA: Diagnosis not present

## 2018-11-18 NOTE — Pre-Procedure Instructions (Signed)
[name]  11/18/2018     [facility] - [place], [place] - [address] [place] [address] Phone: [phone] Fax: [phone]   Your procedure is scheduled on Tuesday, November 22, 2018  Report to [facility] Admitting at 5:30 A.M.  Call this number if you have problems the morning of surgery:  [phone]   Remember: Brush your teeth with your regular toothpaste the morning of surgery.  Do not eat or drink after midnight tonight.  You may drink clear liquids until 4:30A.M. .  Clear liquids allowed are: Water, Juice (non-citric and without pulp), Carbonated beverages, Clear Tea, Black Coffee only, Plain Jell-O only, Gatorade and Plain Popsicles only    Take these medicines the morning of surgery with A SIP OF WATER :  amLODipine (NORVASC)  and  omeprazole (PRILOSEC OTC)  dorzolamide-timolol (COSOPT) eye drops  Stop taking Aspirin (unless otherwise advised by surgeon), vitamins, fish oil and herbal medications. Do not take any NSAIDs ie: Ibuprofen, Advil, Naproxen (Aleve), Motrin, BC and Goody Powder; stop now.     How to Manage Your Diabetes Before and After Surgery  Why is it important to control my blood sugar before and after surgery? . Improving blood sugar levels before and after surgery helps healing and can limit problems. . A way of improving blood sugar control is eating a healthy diet by: o  Eating less sugar and carbohydrates o  Increasing activity/exercise o  Talking with your doctor about reaching your blood sugar goals . High blood sugars (greater than 180 mg/dL) can raise your risk of infections and slow your recovery, so you will need to focus on controlling your diabetes during the weeks before surgery. . Make sure that the doctor who takes care of your diabetes knows about your planned surgery including the date and location.  How do I manage my blood sugar before surgery? . Check your blood  sugar at least 4 times a day, starting 2 days before surgery, to make sure that the level is not too high or low. o Check your blood sugar the morning of your surgery when you wake up and every 2 hours until you get to the Short Stay unit. . If your blood sugar is less than 70 mg/dL, you will need to treat for low blood sugar: o Do not take insulin. o Treat a low blood sugar (less than 70 mg/dL) with  cup of clear juice (cranberry or apple), 4 glucose tablets, OR glucose gel. Recheck blood sugar in 15 minutes after treatment (to make sure it is greater than 70 mg/dL). If your blood sugar is not greater than 70 mg/dL on recheck, call [phone] o  for further instructions. . Report your blood sugar to the short stay nurse when you get to Short Stay.  . If you are admitted to the hospital after surgery: o Your blood sugar will be checked by the staff and you will probably be given insulin after surgery (instead of oral diabetes medicines) to make sure you have good blood sugar levels. o The goal for blood sugar control after surgery is 80-180 mg/dL  WHAT DO I DO ABOUT MY DIABETES MEDICATION?  . THE NIGHT BEFORE SURGERY, take 4 units of glargine (LANTUS)  insulin.     . DO NOT TAKE ANY INSULIN THE MORNING OF SURGERY,  Reviewed and Endorsed by [facility] Patient Education Committee, August 2015  Do not wear jewelry, make-up or  nail polish.  Do not wear lotions, powders, or perfumes, or deodorant.  Do not shave 48 hours prior to surgery.  Men may shave face and neck.  Do not bring valuables to the hospital.  [facility] is not responsible for any belongings or valuables.  Contacts, dentures or bridgework may not be worn into surgery.   Special instructions: See " [facility] Preparing For Surgery " sheet. Please read over the following fact sheets that you were given. Pain Booklet, Coughing and Deep Breathing, Total Joint Packet, MRSA Information and Surgical Site Infection Prevention

## 2018-11-21 ENCOUNTER — Encounter (HOSPITAL_COMMUNITY)
Admission: RE | Admit: 2018-11-21 | Discharge: 2018-11-21 | Disposition: A | Discharge status: Home / Self Care | Payer: BLUE CROSS/BLUE SHIELD | Source: Ambulatory Visit | Attending: Orthopedic Surgery | Admitting: Orthopedic Surgery

## 2018-11-21 ENCOUNTER — Other Ambulatory Visit: Payer: Self-pay

## 2018-11-21 ENCOUNTER — Encounter (HOSPITAL_COMMUNITY): Payer: Self-pay

## 2018-11-21 DIAGNOSIS — W010XXD Fall on same level from slipping, tripping and stumbling without subsequent striking against object, subsequent encounter: Secondary | ICD-10-CM | POA: Diagnosis not present

## 2018-11-21 DIAGNOSIS — Z794 Long term (current) use of insulin: Secondary | ICD-10-CM | POA: Diagnosis not present

## 2018-11-21 DIAGNOSIS — Z91018 Allergy to other foods: Secondary | ICD-10-CM | POA: Diagnosis not present

## 2018-11-21 DIAGNOSIS — E119 Type 2 diabetes mellitus without complications: Secondary | ICD-10-CM | POA: Diagnosis not present

## 2018-11-21 DIAGNOSIS — Z96641 Presence of right artificial hip joint: Secondary | ICD-10-CM | POA: Diagnosis not present

## 2018-11-21 DIAGNOSIS — Z01818 Encounter for other preprocedural examination: Secondary | ICD-10-CM | POA: Insufficient documentation

## 2018-11-21 DIAGNOSIS — S72001G Fracture of unspecified part of neck of right femur, subsequent encounter for closed fracture with delayed healing: Secondary | ICD-10-CM | POA: Diagnosis not present

## 2018-11-21 DIAGNOSIS — H269 Unspecified cataract: Secondary | ICD-10-CM | POA: Diagnosis not present

## 2018-11-21 DIAGNOSIS — I10 Essential (primary) hypertension: Secondary | ICD-10-CM | POA: Diagnosis not present

## 2018-11-21 DIAGNOSIS — S72041A Displaced fracture of base of neck of right femur, initial encounter for closed fracture: Secondary | ICD-10-CM | POA: Diagnosis not present

## 2018-11-21 DIAGNOSIS — Z882 Allergy status to sulfonamides status: Secondary | ICD-10-CM | POA: Diagnosis not present

## 2018-11-21 DIAGNOSIS — R9431 Abnormal electrocardiogram [ECG] [EKG]: Secondary | ICD-10-CM | POA: Insufficient documentation

## 2018-11-21 DIAGNOSIS — S72001A Fracture of unspecified part of neck of right femur, initial encounter for closed fracture: Secondary | ICD-10-CM | POA: Diagnosis not present

## 2018-11-21 DIAGNOSIS — K219 Gastro-esophageal reflux disease without esophagitis: Secondary | ICD-10-CM | POA: Diagnosis not present

## 2018-11-21 DIAGNOSIS — S72001D Fracture of unspecified part of neck of right femur, subsequent encounter for closed fracture with routine healing: Secondary | ICD-10-CM | POA: Diagnosis not present

## 2018-11-21 DIAGNOSIS — E109 Type 1 diabetes mellitus without complications: Secondary | ICD-10-CM | POA: Diagnosis not present

## 2018-11-21 DIAGNOSIS — J302 Other seasonal allergic rhinitis: Secondary | ICD-10-CM | POA: Diagnosis not present

## 2018-11-21 DIAGNOSIS — Z471 Aftercare following joint replacement surgery: Secondary | ICD-10-CM | POA: Diagnosis not present

## 2018-11-21 DIAGNOSIS — Z888 Allergy status to other drugs, medicaments and biological substances status: Secondary | ICD-10-CM | POA: Diagnosis not present

## 2018-11-21 HISTORY — DX: Unspecified cataract: H26.9

## 2018-11-21 HISTORY — DX: Essential (primary) hypertension: I10

## 2018-11-21 HISTORY — DX: Cardiac murmur, unspecified: R01.1

## 2018-11-21 HISTORY — DX: Gastro-esophageal reflux disease without esophagitis: K21.9

## 2018-11-21 HISTORY — DX: Personal history of other medical treatment: Z92.89

## 2018-11-21 HISTORY — DX: Unspecified glaucoma: H40.9

## 2018-11-21 HISTORY — DX: Dependence on wheelchair: Z99.3

## 2018-11-21 HISTORY — DX: Other seasonal allergic rhinitis: J30.2

## 2018-11-21 LAB — HEMOGLOBIN A1C
Hgb A1c MFr Bld: 8.1 % — ABNORMAL HIGH (ref 4.8–5.6)
Mean Plasma Glucose: 185.77 mg/dL

## 2018-11-21 LAB — CBC
HCT: 42.7 % (ref 36.0–46.0)
Hemoglobin: 13.3 g/dL (ref 12.0–15.0)
MCH: 27.1 pg (ref 26.0–34.0)
MCHC: 31.1 g/dL (ref 30.0–36.0)
MCV: 87 fL (ref 80.0–100.0)
Platelets: 313 10*3/uL (ref 150–400)
RBC: 4.91 MIL/uL (ref 3.87–5.11)
RDW: 13.9 % (ref 11.5–15.5)
WBC: 8.5 10*3/uL (ref 4.0–10.5)
nRBC: 0 % (ref 0.0–0.2)

## 2018-11-21 LAB — BASIC METABOLIC PANEL
Anion gap: 10 (ref 5–15)
BUN: 14 mg/dL (ref 8–23)
CO2: 26 mmol/L (ref 22–32)
Calcium: 9.1 mg/dL (ref 8.9–10.3)
Chloride: 102 mmol/L (ref 98–111)
Creatinine, Ser: 0.78 mg/dL (ref 0.44–1.00)
GFR calc Af Amer: >60 mL/min (ref >60)
GFR calc non Af Amer: >60 mL/min (ref >60)
Glucose, Bld: 256 mg/dL — ABNORMAL HIGH (ref 70–99)
Potassium: 3.8 mmol/L (ref 3.5–5.1)
Sodium: 138 mmol/L (ref 135–145)

## 2018-11-21 LAB — SURGICAL PCR SCREEN
MRSA, PCR: NEGATIVE
Staphylococcus aureus: NEGATIVE

## 2018-11-21 LAB — GLUCOSE, CAPILLARY: Glucose-Capillary: 245 mg/dL — ABNORMAL HIGH (ref 70–99)

## 2018-11-21 MED ORDER — BUPIVACAINE LIPOSOME 1.3 % IJ SUSP
10.0000 mL | INTRAMUSCULAR | Status: DC
  Filled 2018-11-21: qty 10

## 2018-11-21 MED ORDER — TRANEXAMIC ACID-NACL 1000-0.7 MG/100ML-% IV SOLN
1000.0000 mg | INTRAVENOUS | Status: AC
  Administered 2018-11-22: 1000 mg via INTRAVENOUS
  Filled 2018-11-21: qty 100

## 2018-11-21 NOTE — H&P (Signed)
[facility] [address]   [address] [name] [address] [phone]  A Division of [facility]  RE: [name], Baker                                  [id number]         DOB: 1955/11/15 INITIAL EVALUATION 11/18/2018  Reason for visit:  Follow up right femoral neck fracture.  She suffered this on 11/11/2018 when she fell on her patio.    HPI: She did not have pain preceding the fall until she hit the ground.  She has been getting around with a walker and wheelchair.  She is anxious to avoid being in the hospital any longer than she has to.  She was seen last night in urgent care.  She has type I diabetes.  She recalls her last A1C as high 6.    OBJECTIVE: The patient is a well appearing female, in no apparent distress.  Pain with any motion of the hip.  Neurovascularly intact.    IMAGES: X-rays demonstrate a displaced varus femoral neck fracture.    ASSESSMENT/PLAN:  Femoral neck fracture that is displaced.  We discussed her options at this time.  I recommended total hip arthroplasty for this.  We are going to set this up for Tuesday.  We will wait on having her into the hospital until then, as she has been getting around at home okay.      [name] Baker.  [name], M.D.  Electronically verified by [name] Baker. [name], M.D. TDM:pmw D 11/18/18 T 11/18/18

## 2018-11-21 NOTE — Anesthesia Preprocedure Evaluation (Addendum)
Anesthesia Evaluation  Patient identified by MRN, date of birth, ID band Patient awake    Reviewed: Allergy & Precautions, NPO status , Patient's Chart, lab work & pertinent test results  Airway Mallampati: II  TM Distance: >3 FB Neck ROM: Full    Dental  (+) Teeth Intact, Dental Advisory Given   Pulmonary    breath sounds clear to auscultation       Cardiovascular hypertension,  Rhythm:Regular Rate:Normal     Neuro/Psych    GI/Hepatic   Endo/Other  diabetes  Renal/GU      Musculoskeletal   Abdominal   Peds  Hematology   Anesthesia Other Findings   Reproductive/Obstetrics                            Anesthesia Physical Anesthesia Plan  ASA: III  Anesthesia Plan: MAC and Spinal   Post-op Pain Management:    Induction: Intravenous  PONV Risk Score and Plan: Ondansetron and Dexamethasone  Airway Management Planned: Natural Airway and Simple Face Mask  Additional Equipment:   Intra-op Plan:   Post-operative Plan:   Informed Consent: I have reviewed the patients History and Physical, chart, labs and discussed the procedure including the risks, benefits and alternatives for the proposed anesthesia with the patient or authorized representative who has indicated his/her understanding and acceptance.       Plan Discussed with: CRNA and Anesthesiologist  Anesthesia Plan Comments: (Had echo 01/2018 to eval murmur. Showed EF 60%-65%, normal wall motion, grade 1 dd, normal valves. )       Anesthesia Quick Evaluation

## 2018-11-21 NOTE — Progress Notes (Signed)
PCP - Dr Laurene Footman Cardiologist -  denies  Chest x-ray - n/a EKG - 11/21/2018 Stress Test - 15 plus years ago ECHO - 02/01/18 Cardiac Cath - denies  Sleep Study - denies CPAP - n/a  Fasting Blood Sugar - 120-150 Checks Blood Sugar _4 or more if needed____ times a day  Blood Thinner Instructions:n/a Aspirin Instructions:  Anesthesia review: no  Patient denies shortness of breath, fever, cough and chest pain at PAT appointment   Patient verbalized understanding of instructions that were given to them at the PAT appointment. Patient was also instructed that they will need to review over the PAT instructions again at home before surgery.  Patient instructed on the hospital visitor restriction policy that is currently in effect.

## 2018-11-22 ENCOUNTER — Encounter (HOSPITAL_COMMUNITY)
Admission: RE | Disposition: A | Discharge status: Home Health | Payer: Self-pay | Source: Home / Self Care | Attending: Orthopedic Surgery

## 2018-11-22 ENCOUNTER — Inpatient Hospital Stay (HOSPITAL_COMMUNITY): Payer: BLUE CROSS/BLUE SHIELD | Admitting: Anesthesiology

## 2018-11-22 ENCOUNTER — Inpatient Hospital Stay (HOSPITAL_COMMUNITY)
Admission: RE | Admit: 2018-11-22 | Discharge: 2018-11-23 | DRG: 470 | Disposition: A | Discharge status: Home Health | Payer: BLUE CROSS/BLUE SHIELD | Attending: Orthopedic Surgery | Admitting: Orthopedic Surgery

## 2018-11-22 ENCOUNTER — Encounter (HOSPITAL_COMMUNITY): Payer: Self-pay | Admitting: *Deleted

## 2018-11-22 ENCOUNTER — Inpatient Hospital Stay (HOSPITAL_COMMUNITY): Payer: BLUE CROSS/BLUE SHIELD

## 2018-11-22 DIAGNOSIS — W010XXD Fall on same level from slipping, tripping and stumbling without subsequent striking against object, subsequent encounter: Secondary | ICD-10-CM | POA: Diagnosis present

## 2018-11-22 DIAGNOSIS — H269 Unspecified cataract: Secondary | ICD-10-CM | POA: Diagnosis present

## 2018-11-22 DIAGNOSIS — Z794 Long term (current) use of insulin: Secondary | ICD-10-CM

## 2018-11-22 DIAGNOSIS — S72001A Fracture of unspecified part of neck of right femur, initial encounter for closed fracture: Secondary | ICD-10-CM

## 2018-11-22 DIAGNOSIS — I10 Essential (primary) hypertension: Secondary | ICD-10-CM | POA: Diagnosis present

## 2018-11-22 DIAGNOSIS — Z888 Allergy status to other drugs, medicaments and biological substances status: Secondary | ICD-10-CM | POA: Diagnosis not present

## 2018-11-22 DIAGNOSIS — Z96641 Presence of right artificial hip joint: Secondary | ICD-10-CM | POA: Diagnosis not present

## 2018-11-22 DIAGNOSIS — Z882 Allergy status to sulfonamides status: Secondary | ICD-10-CM

## 2018-11-22 DIAGNOSIS — Z471 Aftercare following joint replacement surgery: Secondary | ICD-10-CM | POA: Diagnosis not present

## 2018-11-22 DIAGNOSIS — E109 Type 1 diabetes mellitus without complications: Secondary | ICD-10-CM | POA: Diagnosis present

## 2018-11-22 DIAGNOSIS — S72001D Fracture of unspecified part of neck of right femur, subsequent encounter for closed fracture with routine healing: Secondary | ICD-10-CM | POA: Diagnosis present

## 2018-11-22 DIAGNOSIS — Z91018 Allergy to other foods: Secondary | ICD-10-CM

## 2018-11-22 DIAGNOSIS — K219 Gastro-esophageal reflux disease without esophagitis: Secondary | ICD-10-CM | POA: Diagnosis present

## 2018-11-22 DIAGNOSIS — S72041A Displaced fracture of base of neck of right femur, initial encounter for closed fracture: Secondary | ICD-10-CM | POA: Diagnosis not present

## 2018-11-22 DIAGNOSIS — Z419 Encounter for procedure for purposes other than remedying health state, unspecified: Secondary | ICD-10-CM

## 2018-11-22 DIAGNOSIS — J302 Other seasonal allergic rhinitis: Secondary | ICD-10-CM | POA: Diagnosis present

## 2018-11-22 DIAGNOSIS — S72001G Fracture of unspecified part of neck of right femur, subsequent encounter for closed fracture with delayed healing: Principal | ICD-10-CM

## 2018-11-22 DIAGNOSIS — E119 Type 2 diabetes mellitus without complications: Secondary | ICD-10-CM | POA: Diagnosis not present

## 2018-11-22 HISTORY — PX: TOTAL HIP ARTHROPLASTY: SHX124

## 2018-11-22 LAB — GLUCOSE, CAPILLARY
Glucose-Capillary: 175 mg/dL — ABNORMAL HIGH (ref 70–99)
Glucose-Capillary: 221 mg/dL — ABNORMAL HIGH (ref 70–99)
Glucose-Capillary: 225 mg/dL — ABNORMAL HIGH (ref 70–99)
Glucose-Capillary: 318 mg/dL — ABNORMAL HIGH (ref 70–99)
Glucose-Capillary: 228 mg/dL — ABNORMAL HIGH (ref 70–99)

## 2018-11-22 SURGERY — ARTHROPLASTY, HIP, TOTAL, ANTERIOR APPROACH
Anesthesia: Monitor Anesthesia Care | Site: Hip | Laterality: Right

## 2018-11-22 MED ORDER — ALUMINUM-MAGNESIUM-SIMETHICONE 200-200-20 MG/5ML PO SUSP: 30.0000 mL | ORAL | Status: DC | PRN

## 2018-11-22 MED ORDER — FENTANYL CITRATE (PF) 100 MCG/2ML IJ SOLN
INTRAMUSCULAR | Status: DC | PRN
  Administered 2018-11-22: 50 ug via INTRAVENOUS

## 2018-11-22 MED ORDER — INSULIN GLARGINE 100 UNIT/ML ~~LOC~~ SOLN
9.0000 [IU] | Freq: Every day | SUBCUTANEOUS | Status: DC
  Administered 2018-11-22: 9 [IU] via SUBCUTANEOUS
  Filled 2018-11-22 (×2): qty 0.09

## 2018-11-22 MED ORDER — ONDANSETRON HCL 4 MG/2ML IJ SOLN: 4.0000 mg | Freq: Four times a day (QID) | INTRAMUSCULAR | Status: DC | PRN

## 2018-11-22 MED ORDER — KETOROLAC TROMETHAMINE 15 MG/ML IJ SOLN
7.5000 mg | Freq: Four times a day (QID) | INTRAMUSCULAR | Status: AC
  Administered 2018-11-22 – 2018-11-23 (×4): 7.5 mg via INTRAVENOUS
  Filled 2018-11-22 (×6): qty 1

## 2018-11-22 MED ORDER — OXYCODONE HCL 5 MG/5ML PO SOLN: 5.0000 mg | Freq: Once | ORAL | Status: DC | PRN

## 2018-11-22 MED ORDER — POVIDONE-IODINE 10 % EX SWAB
2.0000 "application " | Freq: Once | CUTANEOUS | Status: AC
  Administered 2018-11-22: 2 via TOPICAL

## 2018-11-22 MED ORDER — TRANEXAMIC ACID 1000 MG/10ML IV SOLN
2000.0000 mg | Freq: Once | INTRAVENOUS | Status: DC
  Filled 2018-11-22: qty 20

## 2018-11-22 MED ORDER — MIDAZOLAM HCL 5 MG/5ML IJ SOLN
INTRAMUSCULAR | Status: DC | PRN
  Administered 2018-11-22 (×2): 1 mg via INTRAVENOUS

## 2018-11-22 MED ORDER — POVIDONE-IODINE 10 % EX SWAB: 2.0000 "application " | Freq: Once | CUTANEOUS | Status: AC

## 2018-11-22 MED ORDER — OXYCODONE HCL 5 MG PO TABS: 5.0000 mg | ORAL_TABLET | Freq: Once | ORAL | Status: DC | PRN

## 2018-11-22 MED ORDER — MIDAZOLAM HCL 2 MG/2ML IJ SOLN
INTRAMUSCULAR | Status: AC
  Filled 2018-11-22: qty 2

## 2018-11-22 MED ORDER — ASPIRIN 81 MG PO CHEW
81.0000 mg | CHEWABLE_TABLET | Freq: Two times a day (BID) | ORAL | Status: DC
  Administered 2018-11-23: 81 mg via ORAL
  Filled 2018-11-22: qty 1

## 2018-11-22 MED ORDER — BUPIVACAINE LIPOSOME 1.3 % IJ SUSP
INTRAMUSCULAR | Status: DC | PRN
  Administered 2018-11-22: 10 mL

## 2018-11-22 MED ORDER — POLYETHYLENE GLYCOL 3350 17 G PO PACK: 17.0000 g | PACK | Freq: Every day | ORAL | Status: DC | PRN

## 2018-11-22 MED ORDER — ASPIRIN EC 81 MG PO TBEC: 81.0000 mg | DELAYED_RELEASE_TABLET | Freq: Two times a day (BID) | ORAL | 0 refills | Status: AC

## 2018-11-22 MED ORDER — MENTHOL 3 MG MT LOZG: 1.0000 | LOZENGE | OROMUCOSAL | Status: DC | PRN

## 2018-11-22 MED ORDER — CHLORHEXIDINE GLUCONATE 4 % EX LIQD: 60.0000 mL | Freq: Once | CUTANEOUS | Status: DC

## 2018-11-22 MED ORDER — PROPOFOL 10 MG/ML IV BOLUS
INTRAVENOUS | Status: AC
  Filled 2018-11-22: qty 40

## 2018-11-22 MED ORDER — SORBITOL 70 % SOLN
30.0000 mL | Freq: Every day | Status: DC | PRN
  Filled 2018-11-22: qty 30

## 2018-11-22 MED ORDER — ONDANSETRON HCL 4 MG PO TABS: 4.0000 mg | ORAL_TABLET | Freq: Three times a day (TID) | ORAL | 0 refills | Status: AC | PRN

## 2018-11-22 MED ORDER — DORZOLAMIDE HCL-TIMOLOL MAL 2-0.5 % OP SOLN
1.0000 [drp] | Freq: Two times a day (BID) | OPHTHALMIC | Status: DC
  Administered 2018-11-22 – 2018-11-23 (×2): 1 [drp] via OPHTHALMIC
  Filled 2018-11-22: qty 10

## 2018-11-22 MED ORDER — ONDANSETRON HCL 4 MG/2ML IJ SOLN
INTRAMUSCULAR | Status: DC | PRN
  Administered 2018-11-22: 4 mg via INTRAVENOUS

## 2018-11-22 MED ORDER — HYDROCODONE-ACETAMINOPHEN 5-325 MG PO TABS: 1.0000 | ORAL_TABLET | ORAL | Status: DC | PRN

## 2018-11-22 MED ORDER — METHOCARBAMOL 500 MG PO TABS
500.0000 mg | ORAL_TABLET | Freq: Four times a day (QID) | ORAL | Status: DC | PRN
  Administered 2018-11-22: 500 mg via ORAL
  Filled 2018-11-22: qty 1

## 2018-11-22 MED ORDER — ACETAMINOPHEN 500 MG PO TABS
1000.0000 mg | ORAL_TABLET | Freq: Once | ORAL | Status: DC
  Filled 2018-11-22: qty 2

## 2018-11-22 MED ORDER — GABAPENTIN 100 MG PO CAPS
100.0000 mg | ORAL_CAPSULE | Freq: Three times a day (TID) | ORAL | Status: DC
  Administered 2018-11-22 – 2018-11-23 (×5): 100 mg via ORAL
  Filled 2018-11-22 (×5): qty 1

## 2018-11-22 MED ORDER — METOCLOPRAMIDE HCL 5 MG/ML IJ SOLN: 5.0000 mg | Freq: Three times a day (TID) | INTRAMUSCULAR | Status: DC | PRN

## 2018-11-22 MED ORDER — FENTANYL CITRATE (PF) 100 MCG/2ML IJ SOLN: 25.0000 ug | INTRAMUSCULAR | Status: DC | PRN

## 2018-11-22 MED ORDER — INSULIN ASPART 100 UNIT/ML ~~LOC~~ SOLN
0.0000 [IU] | Freq: Three times a day (TID) | SUBCUTANEOUS | Status: DC
  Administered 2018-11-22: 8 [IU] via SUBCUTANEOUS
  Administered 2018-11-22 – 2018-11-23 (×3): 5 [IU] via SUBCUTANEOUS

## 2018-11-22 MED ORDER — IBUPROFEN 600 MG PO TABS: 600.0000 mg | ORAL_TABLET | Freq: Three times a day (TID) | ORAL | 0 refills | Status: DC | PRN

## 2018-11-22 MED ORDER — SODIUM CHLORIDE 0.9 % IR SOLN: Status: DC | PRN

## 2018-11-22 MED ORDER — MAGNESIUM CITRATE PO SOLN: 1.0000 | Freq: Once | ORAL | Status: DC | PRN

## 2018-11-22 MED ORDER — PROPOFOL 1000 MG/100ML IV EMUL
INTRAVENOUS | Status: AC
  Filled 2018-11-22: qty 100

## 2018-11-22 MED ORDER — METOCLOPRAMIDE HCL 5 MG PO TABS: 5.0000 mg | ORAL_TABLET | Freq: Three times a day (TID) | ORAL | Status: DC | PRN

## 2018-11-22 MED ORDER — LACTATED RINGERS IV SOLN
INTRAVENOUS | Status: DC
  Administered 2018-11-22 (×2): via INTRAVENOUS

## 2018-11-22 MED ORDER — PHENOL 1.4 % MT LIQD: 1.0000 | OROMUCOSAL | Status: DC | PRN

## 2018-11-22 MED ORDER — SODIUM CHLORIDE FLUSH 0.9 % IV SOLN
INTRAVENOUS | Status: DC | PRN
  Administered 2018-11-22 (×3): 10 mL via INTRAVENOUS

## 2018-11-22 MED ORDER — HYDROCODONE-ACETAMINOPHEN 5-325 MG PO TABS: 1.0000 | ORAL_TABLET | Freq: Four times a day (QID) | ORAL | 0 refills | Status: AC | PRN

## 2018-11-22 MED ORDER — MORPHINE SULFATE (PF) 2 MG/ML IV SOLN: 0.5000 mg | INTRAVENOUS | Status: DC | PRN

## 2018-11-22 MED ORDER — DOCUSATE SODIUM 100 MG PO CAPS
100.0000 mg | ORAL_CAPSULE | Freq: Two times a day (BID) | ORAL | Status: DC
  Administered 2018-11-22 – 2018-11-23 (×3): 100 mg via ORAL
  Filled 2018-11-22 (×3): qty 1

## 2018-11-22 MED ORDER — METHOCARBAMOL 1000 MG/10ML IJ SOLN
500.0000 mg | Freq: Four times a day (QID) | INTRAVENOUS | Status: DC | PRN
  Filled 2018-11-22: qty 5

## 2018-11-22 MED ORDER — GABAPENTIN 100 MG PO CAPS: 100.0000 mg | ORAL_CAPSULE | Freq: Three times a day (TID) | ORAL | 0 refills | Status: AC | PRN

## 2018-11-22 MED ORDER — OMEPRAZOLE MAGNESIUM 20 MG PO TBEC: 40.0000 mg | DELAYED_RELEASE_TABLET | Freq: Every day | ORAL | Status: DC

## 2018-11-22 MED ORDER — AMLODIPINE BESYLATE 2.5 MG PO TABS
2.5000 mg | ORAL_TABLET | Freq: Every day | ORAL | Status: DC
  Administered 2018-11-22: 2.5 mg via ORAL
  Filled 2018-11-22: qty 1

## 2018-11-22 MED ORDER — ONDANSETRON HCL 4 MG PO TABS: 4.0000 mg | ORAL_TABLET | Freq: Four times a day (QID) | ORAL | Status: DC | PRN

## 2018-11-22 MED ORDER — DEXAMETHASONE SODIUM PHOSPHATE 10 MG/ML IJ SOLN
INTRAMUSCULAR | Status: DC | PRN
  Administered 2018-11-22: 5 mg via INTRAVENOUS

## 2018-11-22 MED ORDER — METHOCARBAMOL 500 MG PO TABS: 500.0000 mg | ORAL_TABLET | Freq: Three times a day (TID) | ORAL | 0 refills | Status: DC | PRN

## 2018-11-22 MED ORDER — 0.9 % SODIUM CHLORIDE (POUR BTL) OPTIME
TOPICAL | Status: DC | PRN
  Administered 2018-11-22: 08:00:00 1000 mL

## 2018-11-22 MED ORDER — PHENYLEPHRINE 40 MCG/ML (10ML) SYRINGE FOR IV PUSH (FOR BLOOD PRESSURE SUPPORT)
PREFILLED_SYRINGE | INTRAVENOUS | Status: DC | PRN
  Administered 2018-11-22: 120 ug via INTRAVENOUS

## 2018-11-22 MED ORDER — CEFAZOLIN SODIUM-DEXTROSE 2-4 GM/100ML-% IV SOLN
2.0000 g | INTRAVENOUS | Status: AC
  Administered 2018-11-22: 2 g via INTRAVENOUS
  Filled 2018-11-22: qty 100

## 2018-11-22 MED ORDER — INSULIN ASPART 100 UNIT/ML ~~LOC~~ SOLN: 5.0000 [IU] | Freq: Three times a day (TID) | SUBCUTANEOUS | Status: DC

## 2018-11-22 MED ORDER — PANTOPRAZOLE SODIUM 40 MG PO TBEC
80.0000 mg | DELAYED_RELEASE_TABLET | Freq: Every day | ORAL | Status: DC
  Administered 2018-11-23: 80 mg via ORAL
  Filled 2018-11-22: qty 2

## 2018-11-22 MED ORDER — FENTANYL CITRATE (PF) 250 MCG/5ML IJ SOLN
INTRAMUSCULAR | Status: AC
  Filled 2018-11-22: qty 5

## 2018-11-22 MED ORDER — CEFAZOLIN SODIUM-DEXTROSE 1-4 GM/50ML-% IV SOLN
1.0000 g | Freq: Four times a day (QID) | INTRAVENOUS | Status: AC
  Administered 2018-11-22 (×2): 1 g via INTRAVENOUS
  Filled 2018-11-22 (×2): qty 50

## 2018-11-22 MED ORDER — PROPOFOL 500 MG/50ML IV EMUL
INTRAVENOUS | Status: DC | PRN
  Administered 2018-11-22: 45 ug/kg/min via INTRAVENOUS

## 2018-11-22 MED ORDER — ONDANSETRON HCL 4 MG/2ML IJ SOLN: 4.0000 mg | Freq: Once | INTRAMUSCULAR | Status: DC | PRN

## 2018-11-22 MED ORDER — ACETAMINOPHEN 325 MG PO TABS
325.0000 mg | ORAL_TABLET | Freq: Four times a day (QID) | ORAL | Status: DC | PRN
  Administered 2018-11-23: 15:00:00 650 mg via ORAL
  Filled 2018-11-22: qty 2

## 2018-11-22 MED ORDER — LACTATED RINGERS IV SOLN
INTRAVENOUS | Status: DC
  Administered 2018-11-22: 07:00:00 via INTRAVENOUS

## 2018-11-22 MED ORDER — DIPHENHYDRAMINE HCL 12.5 MG/5ML PO ELIX: 12.5000 mg | ORAL_SOLUTION | ORAL | Status: DC | PRN

## 2018-11-22 MED ORDER — QUINAPRIL HCL 10 MG PO TABS
40.0000 mg | ORAL_TABLET | Freq: Every day | ORAL | Status: DC
  Administered 2018-11-23: 40 mg via ORAL
  Filled 2018-11-22 (×3): qty 4

## 2018-11-22 MED ORDER — ALPRAZOLAM 0.25 MG PO TABS: 0.2500 mg | ORAL_TABLET | Freq: Every day | ORAL | Status: DC | PRN

## 2018-11-22 SURGICAL SUPPLY — 57 items
BAG DECANTER FOR FLEXI CONT (MISCELLANEOUS) IMPLANT
BLADE SAG 18X100X1.27 (BLADE) IMPLANT
CLSR STERI-STRIP ANTIMIC 1/2X4 (GAUZE/BANDAGES/DRESSINGS) ×2 IMPLANT
COVER PERINEAL POST (MISCELLANEOUS) ×2 IMPLANT
COVER SURGICAL LIGHT HANDLE (MISCELLANEOUS) ×2 IMPLANT
COVER WAND RF STERILE (DRAPES) ×2 IMPLANT
DRAPE C-ARM 42X72 X-RAY (DRAPES) ×2 IMPLANT
DRAPE STERI IOBAN 125X83 (DRAPES) ×2 IMPLANT
DRAPE U-SHAPE 47X51 STRL (DRAPES) IMPLANT
DRSG MEPILEX BORDER 4X8 (GAUZE/BANDAGES/DRESSINGS) ×2 IMPLANT
DURAPREP 26ML APPLICATOR (WOUND CARE) ×2 IMPLANT
ELECT BLADE 4.0 EZ CLEAN MEGAD (MISCELLANEOUS) ×2
ELECT REM PT RETURN 9FT ADLT (ELECTROSURGICAL) ×2
ELECTRODE BLDE 4.0 EZ CLN MEGD (MISCELLANEOUS) ×1 IMPLANT
ELECTRODE REM PT RTRN 9FT ADLT (ELECTROSURGICAL) ×1 IMPLANT
FACESHIELD WRAPAROUND (MASK) ×4 IMPLANT
FACESHIELD WRAPAROUND OR TEAM (MASK) ×2 IMPLANT
GLOVE BIO SURGEON STRL SZ7.5 (GLOVE) ×4 IMPLANT
GLOVE BIOGEL PI IND STRL 8 (GLOVE) ×2 IMPLANT
GLOVE BIOGEL PI INDICATOR 8 (GLOVE) ×2
GOWN STRL REUS W/ TWL LRG LVL3 (GOWN DISPOSABLE) ×2 IMPLANT
GOWN STRL REUS W/TWL LRG LVL3 (GOWN DISPOSABLE) ×4
HEAD BIOLOX HIP 36/-5 (Joint) IMPLANT
HIP BIOLOX HD 36/-5 (Joint) ×2 IMPLANT
INSERT POLYETHYLENE 36M-0 (Insert) ×1 IMPLANT
KIT BASIN OR (CUSTOM PROCEDURE TRAY) ×2 IMPLANT
KIT TURNOVER KIT B (KITS) ×2 IMPLANT
MANIFOLD NEPTUNE II (INSTRUMENTS) ×2 IMPLANT
NDL SAFETY ECLIPSE 18X1.5 (NEEDLE) IMPLANT
NEEDLE HYPO 18GX1.5 SHARP (NEEDLE)
NEEDLE HYPO 22GX1.5 SAFETY (NEEDLE) ×2 IMPLANT
NS IRRIG 1000ML POUR BTL (IV SOLUTION) ×2 IMPLANT
PACK TOTAL JOINT (CUSTOM PROCEDURE TRAY) ×2 IMPLANT
PAD ARMBOARD 7.5X6 YLW CONV (MISCELLANEOUS) ×2 IMPLANT
SCREW HEX LP 6.5X20 (Screw) ×1 IMPLANT
SHELL ACETAB TRIDENT 48 (Shell) ×1 IMPLANT
SPONGE LAP 18X18 RF (DISPOSABLE) IMPLANT
STEM FEM ACCOLADE 38X102X30 S3 (Stem) ×1 IMPLANT
STRIP CLOSURE SKIN 1/2X4 (GAUZE/BANDAGES/DRESSINGS) ×1 IMPLANT
SUT MNCRL AB 4-0 PS2 18 (SUTURE) ×2 IMPLANT
SUT VIC AB 0 CT1 27 (SUTURE) ×2
SUT VIC AB 0 CT1 27XBRD ANBCTR (SUTURE) ×1 IMPLANT
SUT VIC AB 1 CT1 27 (SUTURE) ×2
SUT VIC AB 1 CT1 27XBRD ANBCTR (SUTURE) ×1 IMPLANT
SUT VIC AB 2-0 CT1 27 (SUTURE) ×2
SUT VIC AB 2-0 CT1 TAPERPNT 27 (SUTURE) ×1 IMPLANT
SUT VLOC 180 0 24IN GS25 (SUTURE) IMPLANT
SYR 50ML LL SCALE MARK (SYRINGE) ×2 IMPLANT
SYR BULB IRRIGATION 50ML (SYRINGE) ×2 IMPLANT
SYRINGE 20CC LL (MISCELLANEOUS) IMPLANT
TOWEL OR 17X24 6PK STRL BLUE (TOWEL DISPOSABLE) ×2 IMPLANT
TOWEL OR 17X26 10 PK STRL BLUE (TOWEL DISPOSABLE) ×2 IMPLANT
TRAY CATH 16FR W/PLASTIC CATH (SET/KITS/TRAYS/PACK) ×1 IMPLANT
TRAY FOLEY MTR SLVR 16FR STAT (SET/KITS/TRAYS/PACK) IMPLANT
TUBE CONNECTING 12X1/4 (SUCTIONS) ×1 IMPLANT
WATER STERILE IRR 1000ML POUR (IV SOLUTION) ×2 IMPLANT
YANKAUER SUCT BULB TIP NO VENT (SUCTIONS) ×4 IMPLANT

## 2018-11-22 NOTE — Op Note (Signed)
11/22/2018  9:33 AM  PATIENT:  [name]   MRN: [id number]  PRE-OPERATIVE DIAGNOSIS:  RIGHT HIP FRACTURE  POST-OPERATIVE DIAGNOSIS:  RIGHT HIP FRACTURE  PROCEDURE:  Procedure(s): TOTAL HIP ARTHROPLASTY ANTERIOR APPROACH  PREOPERATIVE INDICATIONS:    [name] is an 63 y.o. female who has a diagnosis of <principal problem not specified> and elected for surgical management after failing conservative treatment.  The risks benefits and alternatives were discussed with the patient including but not limited to the risks of nonoperative treatment, versus surgical intervention including infection, bleeding, nerve injury, periprosthetic fracture, the need for revision surgery, dislocation, leg length discrepancy, blood clots, cardiopulmonary complications, morbidity, mortality, among others, and they were willing to proceed.     OPERATIVE REPORT     SURGEON:   Renette Butters, MD    ASSISTANT:  Roxan Hockey, PA-C, he was present and scrubbed throughout the case, critical for completion in a timely fashion, and for retraction, instrumentation, and closure.     ANESTHESIA:  General    COMPLICATIONS:  None.     COMPONENTS:  [employer] acolade fit femur size 3 with a 36 mm -5 head ball and a PSL acetabular shell size 48 with a  polyethylene liner    PROCEDURE IN DETAIL:   The patient was met in the holding area and  identified.  The appropriate hip was identified and marked at the operative site.  The patient was then transported to the OR  and  placed under anesthesia per that record.  At that point, the patient was  placed in the supine position and  secured to the operating room table and all bony prominences padded. He received pre-operative antibiotics    The operative lower extremity was prepped from the iliac crest to the distal leg.  Sterile draping was performed.  Time out was performed prior to incision.      Skin incision was made just 2 cm lateral to the ASIS   extending in line with the tensor fascia lata. Electrocautery was used to control all bleeders. I dissected down sharply to the fascia of the tensor fascia lata was confirmed that the muscle fibers beneath were running posteriorly. I then incised the fascia over the superficial tensor fascia lata in line with the incision. The fascia was elevated off the anterior aspect of the muscle the muscle was retracted posteriorly and protected throughout the case. I then used electrocautery to incise the tensor fascia lata fascia control and all bleeders. Immediately visible was the fat over top of the anterior neck and capsule.  I removed the anterior fat from the capsule and elevated the rectus muscle off of the anterior capsule. I then removed a large time of capsule. The retractors were then placed over the anterior acetabulum as well as around the superior and inferior neck.  I then made a femoral neck cut. Then used the power corkscrew to remove the femoral head from the acetabulum and thoroughly irrigated the acetabulum. I sized the femoral head.    I then exposed the deep acetabulum, cleared out any tissue including the ligamentum teres.   After adequate visualization, I excised the labrum, and then sequentially reamed.  I then impacted the acetabular implant into place using fluoroscopy for guidance.  Appropriate version and inclination was confirmed clinically matching their bony anatomy, and with fluoroscopy.  I placed a 20 mm screw in the posterior/superio position with an excellent bite.    I then placed the polyethylene liner in place  I then adducted the leg and released the external rotators from the posterior femur allowing it to be easily delivered up lateral and anterior to the acetabulum for preparation of the femoral canal.    I then prepared the proximal femur using the cookie-cutter and then sequentially reamed and broached.  A trial broach, neck, and head was utilized, and I reduced the  hip and used floroscopy to assess the neck length and femoral implant.  I then impacted the femoral prosthesis into place into the appropriate version. The hip was then reduced and fluoroscopy confirmed appropriate position. Leg lengths were restored.  I then irrigated the hip copiously again with, and repaired the fascia with Vicryl, followed by monocryl for the subcutaneous tissue, Monocryl for the skin, Steri-Strips and sterile gauze. The patient was then awakened and returned to PACU in stable and satisfactory condition. There were no complications.  POST OPERATIVE PLAN: WBAT, DVT px: SCD's/TED, ambulation and chemical dvt px  Edmonia Lynch, MD Orthopedic Surgeon [phone]

## 2018-11-22 NOTE — Progress Notes (Signed)
Inpatient Diabetes Program Recommendations  AACE/ADA: New Consensus Statement on Inpatient Glycemic Control (2015)  Target Ranges:  Prepandial:   less than 140 mg/dL      Peak postprandial:   less than 180 mg/dL (1-2 hours)      Critically ill patients:  140 - 180 mg/dL   Lab Results  Component Value Date   GLUCAP 318 (H) 11/22/2018   HGBA1C 8.1 (H) 11/21/2018    Review of Glycemic Control  Diabetes history: DM 2 Outpatient Diabetes medications: Lantus 9 units qhs, Novolog 5-8 units tid Current orders for Inpatient glycemic control: Lantus 9 units qhs, Novolog 0-15 units tid   A1c 8.1% on 4/6  Inpatient Diabetes Program Recommendations:    Patient received Decadron 5 mg this am during surgery. Glucose trends increasing into the 300 range. Consider adding Novolog 3 units tid meal coverage if patient consumes at least 50% of meals.  Thanks,  Christena Deem RN, MSN, BC-ADM Inpatient Diabetes Coordinator Team Pager [phone] (8a-5p)

## 2018-11-22 NOTE — Anesthesia Procedure Notes (Signed)
Procedure Name: MAC Date/Time: 11/22/2018 7:45 AM Performed by: Scheryl Darter, CRNA Pre-anesthesia Checklist: Patient identified, Emergency Drugs available, Suction available, Patient being monitored and Timeout performed Patient Re-evaluated:Patient Re-evaluated prior to induction Oxygen Delivery Method: Simple face mask Placement Confirmation: positive ETCO2

## 2018-11-22 NOTE — Evaluation (Signed)
Physical Therapy Evaluation Patient Details Name: [name] MRN: [id number] DOB: 06/26/1956 Today's Date: 11/22/2018   History of Present Illness  Pt is a 63 y/o female s/p R THA (direct anterior) following fall that resulted in R hip fx. PMH includes DM and pt reported R knee surgery.   Clinical Impression  Pt s/p surgery above with deficits below. Pt requiring min to min guard A for mobility tasks using RW. Pt very anxious and required reassurance throughout session. Feel pt will progress well and be able to d/c home with HHPT. Pt reports sister will be staying with her. Will continue to follow acutely to maximize functional mobility independence and safety.     Follow Up Recommendations Home health PT;Follow surgeon's recommendation for DC plan and follow-up therapies;Supervision for mobility/OOB    Equipment Recommendations  3in1 (PT)    Recommendations for Other Services OT consult     Precautions / Restrictions Precautions Precautions: Fall Restrictions Weight Bearing Restrictions: Yes RLE Weight Bearing: Weight bearing as tolerated      Mobility  Bed Mobility Overal bed mobility: Needs Assistance Bed Mobility: Supine to Sit     Supine to sit: Min assist     General bed mobility comments: Min A for RLE assist. Increased time required to come to EOB.   Transfers Overall transfer level: Needs assistance Equipment used: Rolling walker (2 wheeled) Transfers: Sit to/from Stand Sit to Stand: Min assist         General transfer comment: Min A for lift assist and steadying assist. Cues for safe hand placement.   Ambulation/Gait Ambulation/Gait assistance: Min guard Gait Distance (Feet): 5 Feet Assistive device: Rolling walker (2 wheeled) Gait Pattern/deviations: Step-to pattern;Decreased step length - right;Decreased step length - left;Decreased weight shift to right;Antalgic Gait velocity: Decreased    General Gait Details: Slow, antalgic gait. Pt very  hesitant to bear weight on RLE. Pt with very short steps to chair. Cues for sequencing using RW.   Stairs            Wheelchair Mobility    Modified Rankin (Stroke Patients Only)       Balance Overall balance assessment: Needs assistance Sitting-balance support: No upper extremity supported;Feet supported Sitting balance-Leahy Scale: Good     Standing balance support: Bilateral upper extremity supported;During functional activity Standing balance-Leahy Scale: Poor Standing balance comment: Reliant on BUE support.                              Pertinent Vitals/Pain Pain Assessment: Faces Faces Pain Scale: Hurts little more Pain Location: R hip and R knee  Pain Descriptors / Indicators: Aching;Operative site guarding Pain Intervention(s): Limited activity within patient's tolerance;Monitored during session;Repositioned    Home Living Family/patient expects to be discharged to:: Private residence Living Arrangements: Other relatives(sister) Available Help at Discharge: Family;Available 24 hours/day Type of Home: House Home Access: Stairs to enter Entrance Stairs-Rails: None Entrance Stairs-Number of Steps: 2 Home Layout: Multi-level;Able to live on main level with bedroom/bathroom Home Equipment: Dan HumphreysWalker - 2 wheels;Cane - single point;Grab bars - tub/shower;Wheelchair - manual      Prior Function Level of Independence: Independent with assistive device(s)         Comments: Reports since her fall she was able to use RW some days and was using WC other days.      Hand Dominance        Extremity/Trunk Assessment   Upper Extremity Assessment Upper Extremity  Assessment: Defer to OT evaluation    Lower Extremity Assessment Lower Extremity Assessment: RLE deficits/detail RLE Deficits / Details: Deficits consistent with post op pain and weakness. Pt reports R knee pain at baseline, however, very concerned that something happened to it when she fell.      Cervical / Trunk Assessment Cervical / Trunk Assessment: Normal  Communication   Communication: No difficulties  Cognition Arousal/Alertness: Awake/alert Behavior During Therapy: Anxious Overall Cognitive Status: Within Functional Limits for tasks assessed                                 General Comments: Pt very anxious throughout session. Required extensive education about weight bearing status, no hip precautions, etc.       General Comments      Exercises Total Joint Exercises Ankle Circles/Pumps: AROM;Both;20 reps;Supine   Assessment/Plan    PT Assessment Patient needs continued PT services  PT Problem List Decreased strength;Decreased range of motion;Decreased balance;Decreased mobility;Decreased knowledge of use of DME;Decreased knowledge of precautions;Pain       PT Treatment Interventions DME instruction;Gait training;Stair training;Therapeutic activities;Functional mobility training;Therapeutic exercise;Balance training;Patient/family education    PT Goals (Current goals can be found in the Care Plan section)  Acute Rehab PT Goals Patient Stated Goal: to get better and go home  PT Goal Formulation: With patient Time For Goal Achievement: 12/06/18 Potential to Achieve Goals: Good    Frequency 7X/week   Barriers to discharge        Co-evaluation               AM-PAC PT "6 Clicks" Mobility  Outcome Measure Help needed turning from your back to your side while in a flat bed without using bedrails?: A Little Help needed moving from lying on your back to sitting on the side of a flat bed without using bedrails?: A Little Help needed moving to and from a bed to a chair (including a wheelchair)?: A Little Help needed standing up from a chair using your arms (e.g., wheelchair or bedside chair)?: A Little Help needed to walk in hospital room?: A Little Help needed climbing 3-5 steps with a railing? : A Lot 6 Click Score: 17    End of  Session Equipment Utilized During Treatment: Gait belt Activity Tolerance: Patient tolerated treatment well Patient left: in chair;with call bell/phone within reach Nurse Communication: Mobility status PT Visit Diagnosis: Other abnormalities of gait and mobility (R26.89);Pain;History of falling (Z91.81) Pain - Right/Left: Right Pain - part of body: Hip;Knee    Time: 5809-9833 PT Time Calculation (min) (ACUTE ONLY): 24 min   Charges:   PT Evaluation $PT Eval Low Complexity: 1 Low PT Treatments $Therapeutic Activity: 8-22 mins        Gladys Damme, PT, DPT  Acute Rehabilitation Services  Pager: [phone] Office: [phone]   Lehman Prom 11/22/2018, 4:28 PM

## 2018-11-22 NOTE — Progress Notes (Signed)
PT Cancellation Note  Patient Details Name: [name] MRN: [id number] DOB: 28-Jun-1956   Cancelled Treatment:    Reason Eval/Treat Not Completed: Other (comment) .  Pt in the OR at 8 am.  May check back later this PM to see if she can mobilize later today.    Thanks,  Rollene Rotunda. [name], PT, DPT  Acute Rehabilitation [phone] pager [phone]) [phone] office    [name] 11/22/2018, 11:40 AM

## 2018-11-22 NOTE — Anesthesia Postprocedure Evaluation (Signed)
Anesthesia Post Note  Patient: [name]  Procedure(s) Performed: TOTAL HIP ARTHROPLASTY ANTERIOR APPROACH (Right Hip)     Patient location during evaluation: PACU Anesthesia Type: MAC Level of consciousness: oriented and awake and alert Pain management: pain level controlled Vital Signs Assessment: post-procedure vital signs reviewed and stable Respiratory status: spontaneous breathing, respiratory function stable and patient connected to nasal cannula oxygen Cardiovascular status: blood pressure returned to baseline and stable Postop Assessment: no headache, no backache and no apparent nausea or vomiting Anesthetic complications: no    Last Vitals:  Vitals:   11/22/18 1059 11/22/18 1340  BP: (!) 131/58 (!) 146/67  Pulse: 67 98  Resp:  16  Temp: 36.6 C 36.7 C  SpO2: 100% 98%    Last Pain:  Vitals:   11/22/18 1340  TempSrc: Oral  PainSc:                  [name]

## 2018-11-22 NOTE — Anesthesia Procedure Notes (Signed)
Spinal  Patient location during procedure: OR Start time: 11/22/2018 7:35 AM End time: 11/22/2018 7:40 AM Staffing Anesthesiologist: Kipp Brood, MD Performed: anesthesiologist  Preanesthetic Checklist Completed: patient identified, site marked, surgical consent, pre-op evaluation, timeout performed, IV checked, risks and benefits discussed and monitors and equipment checked Spinal Block Patient position: sitting Prep: DuraPrep Patient monitoring: heart rate, cardiac monitor, continuous pulse ox and blood pressure Approach: midline Location: L3-4 Injection technique: single-shot Needle Needle type: Sprotte and Whitacre  Needle gauge: 24 G Needle length: 9 cm Assessment Sensory level: T4 Additional Notes 12 mg 0.75% Bupivacaine injected easily

## 2018-11-22 NOTE — Progress Notes (Signed)
Received patient from PACU. Patient alert and oriented. Dressing clean dry and intact. Patient oriented to call bell and bed controls. Will continue to monitor.

## 2018-11-22 NOTE — Transfer of Care (Signed)
Immediate Anesthesia Transfer of Care Note  Patient: [name]  Procedure(s) Performed: TOTAL HIP ARTHROPLASTY ANTERIOR APPROACH (Right Hip)  Patient Location: PACU  Anesthesia Type:MAC and Spinal  Level of Consciousness: awake, alert , oriented and sedated  Airway & Oxygen Therapy: Patient Spontanous Breathing and Patient connected to nasal cannula oxygen  Post-op Assessment: Report given to RN, Post -op Vital signs reviewed and stable and Patient moving all extremities  Post vital signs: Reviewed and stable  Last Vitals:  Vitals Value Taken Time  BP 135/73 11/22/2018  9:51 AM  Temp    Pulse 82 11/22/2018  9:54 AM  Resp 15 11/22/2018  9:54 AM  SpO2 100 % 11/22/2018  9:54 AM  Vitals shown include unvalidated device data.  Last Pain:  Vitals:   11/22/18 0550  PainSc: 3       Patients Stated Pain Goal: 3 (41/42/39 5320)  Complications: No apparent anesthesia complications

## 2018-11-22 NOTE — Interval H&P Note (Signed)
I participated in the care of this patient and agree with the above history, physical and evaluation. I performed a review of the history and a physical exam as detailed   Latash Nouri Daniel Farrie Sann MD  

## 2018-11-23 ENCOUNTER — Encounter (HOSPITAL_COMMUNITY): Payer: Self-pay | Admitting: Orthopedic Surgery

## 2018-11-23 LAB — GLUCOSE, CAPILLARY
Glucose-Capillary: 214 mg/dL — ABNORMAL HIGH (ref 70–99)
Glucose-Capillary: 241 mg/dL — ABNORMAL HIGH (ref 70–99)

## 2018-11-23 NOTE — Discharge Instructions (Signed)
You may bear weight as tolerated. Keep your dressing on and dry until follow up. Take medicine to prevent blood clots as directed (Aspirn 81 mg twice per day).  Continue your omeprazole for gastric protection. Take pain medicine as needed with the goal of transitioning to over the counter medicines.  Stop breakthrough pain medication (Norco) as soon as you are able.  INSTRUCTIONS AFTER JOINT REPLACEMENT   o Remove items at home which could result in a fall. This includes throw rugs or furniture in walking pathways o ICE to the affected joint every three hours while awake for 30 minutes at a time, for at least the first 3-5 days, and then as needed for pain and swelling.  Continue to use ice for pain and swelling. You may notice swelling that will progress down to the foot and ankle.  This is normal after surgery.  Elevate your leg when you are not up walking on it.   o Continue to use the breathing machine you got in the hospital (incentive spirometer) which will help keep your temperature down.  It is common for your temperature to cycle up and down following surgery, especially at night when you are not up moving around and exerting yourself.  The breathing machine keeps your lungs expanded and your temperature down.   DIET:  As you were doing prior to hospitalization, we recommend a well-balanced diet.  DRESSING / WOUND CARE / SHOWERING  You may shower 3 days after surgery, but keep the wounds dry during showering.  You may use an occlusive plastic wrap (Press'n Seal for example) with blue painter's tape at edges, NO SOAKING/SUBMERGING IN THE BATHTUB.  If the bandage gets wet, change with a clean dry gauze.  If the incision gets wet, pat the wound dry with a clean towel.  ACTIVITY  o Increase activity slowly as tolerated, but follow the weight bearing instructions below.   o No driving for 6 weeks or until further direction given by your physician.  You cannot drive while taking narcotics.    o No lifting or carrying greater than 10 lbs. until further directed by your surgeon. o Avoid periods of inactivity such as sitting longer than an hour when not asleep. This helps prevent blood clots.  o You may return to work once you are authorized by your doctor.     WEIGHT BEARING   Weight bearing as tolerated with assist device (walker, cane, etc) as directed, use it as long as suggested by your surgeon or therapist, typically at least 4-6 weeks.   EXERCISES  Results after joint replacement surgery are often greatly improved when you follow the exercise, range of motion and muscle strengthening exercises prescribed by your doctor. Safety measures are also important to protect the joint from further injury. Any time any of these exercises cause you to have increased pain or swelling, decrease what you are doing until you are comfortable again and then slowly increase them. If you have problems or questions, call your caregiver or physical therapist for advice.   Rehabilitation is important following a joint replacement. After just a few days of immobilization, the muscles of the leg can become weakened and shrink (atrophy).  These exercises are designed to build up the tone and strength of the thigh and leg muscles and to improve motion. Often times heat used for twenty to thirty minutes before working out will loosen up your tissues and help with improving the range of motion but do not use  heat for the first two weeks following surgery (sometimes heat can increase post-operative swelling).   These exercises can be done on a training (exercise) mat, on the floor, on a table or on a bed. Use whatever works the best and is most comfortable for you.    Use music or television while you are exercising so that the exercises are a pleasant break in your day. This will make your life better with the exercises acting as a break in your routine that you can look forward to.   Perform all exercises  about fifteen times, three times per day or as directed.  You should exercise both the operative leg and the other leg as well.  Exercises include:    Quad Sets - Tighten up the muscle on the front of the thigh (Quad) and hold for 5-10 seconds.    Straight Leg Raises - With your knee straight (if you were given a brace, keep it on), lift the leg to 60 degrees, hold for 3 seconds, and slowly lower the leg.  Perform this exercise against resistance later as your leg gets stronger.   Leg Slides: Lying on your back, slowly slide your foot toward your buttocks, bending your knee up off the floor (only go as far as is comfortable). Then slowly slide your foot back down until your leg is flat on the floor again.   Angel Wings: Lying on your back spread your legs to the side as far apart as you can without causing discomfort.   Hamstring Strength:  Lying on your back, push your heel against the floor with your leg straight by tightening up the muscles of your buttocks.  Repeat, but this time bend your knee to a comfortable angle, and push your heel against the floor.  You may put a pillow under the heel to make it more comfortable if necessary.   A rehabilitation program following joint replacement surgery can speed recovery and prevent re-injury in the future due to weakened muscles. Contact your doctor or a physical therapist for more information on knee rehabilitation.    CONSTIPATION  Constipation is defined medically as fewer than three stools per week and severe constipation as less than one stool per week.  Even if you have a regular bowel pattern at home, your normal regimen is likely to be disrupted due to multiple reasons following surgery.  Combination of anesthesia, postoperative narcotics, change in appetite and fluid intake all can affect your bowels.   YOU MUST use at least one of the following options; they are listed in order of increasing strength to get the job done.  They are all  available over the counter, and you may need to use some, POSSIBLY even all of these options:    Drink plenty of fluids (prune juice may be helpful) and high fiber foods Colace 100 mg by mouth twice a day  Senokot for constipation as directed and as needed Dulcolax (bisacodyl), take with full glass of water  Miralax (polyethylene glycol) once or twice a day as needed.  If you have tried all these things and are unable to have a bowel movement in the first 3-4 days after surgery call either your surgeon or your primary doctor.    If you experience loose stools or diarrhea, hold the medications until you stool forms back up.  If your symptoms do not get better within 1 week or if they get worse, check with your doctor.  If you experience "the  worst abdominal pain ever" or develop nausea or vomiting, please contact the office immediately for further recommendations for treatment.   ITCHING:  If you experience itching with your medications, try taking only a single pain pill, or even half a pain pill at a time.  You can also use Benadryl over the counter for itching or also to help with sleep.   TED HOSE STOCKINGS:  Use stockings on both legs until for at least 2 weeks or as directed by physician office. They may be removed at night for sleeping.  MEDICATIONS:  See your medication summary on the After Visit Summary that nursing will review with you.  You may have some home medications which will be placed on hold until you complete the course of blood thinner medication.  It is important for you to complete the blood thinner medication as prescribed.  PRECAUTIONS:  If you experience chest pain or shortness of breath - call 911 immediately for transfer to the hospital emergency department.   If you develop a fever greater that 101 F, purulent drainage from wound, increased redness or drainage from wound, foul odor from the wound/dressing, or calf pain - CONTACT YOUR SURGEON.                                                    FOLLOW-UP APPOINTMENTS:  If you do not already have a post-op appointment, please call the office for an appointment to be seen by your surgeon.  Guidelines for how soon to be seen are listed in your After Visit Summary, but are typically between 1-4 weeks after surgery.  OTHER INSTRUCTIONS:     MAKE SURE YOU:   Understand these instructions.   Get help right away if you are not doing well or get worse.    Thank you for letting us be a part of your medical care team.  It is a privilege we respect greatly.  We hope these instructions will help you stay on track for a fast and full recovery!

## 2018-11-23 NOTE — TOC Transition Note (Signed)
Transition of Care [facility]) - CM/SW Discharge Note   Patient Details  Name: [name] MRN: [id number] Date of Birth: [birth year]  [facility] CM/SW Contact:  Durenda Guthrie, RN Phone Number: 11/23/2018, 11:01 AM   Clinical Narrative:   63 yr old female s/p right hemiarthroplasty secondary to a fall. Patient was setup with [facility] by Dr.'s office.  DME has been ordered.     Final next level of care: Home w Home Health Services Barriers to Discharge: No Barriers Identified   Patient Goals and CMS Choice        Discharge Placement                       Discharge Plan and Services   Discharge Planning Services: CM Consult Post Acute Care Choice: Durable Medical Equipment, Home Health          DME Arranged: 3-N-1 DME Agency: [facility] HH Arranged: PT, OT HH Agency: [facility] (formerly [facility])   Social Determinants of Health (SDOH) Interventions     Readmission Risk Interventions No flowsheet data found.

## 2018-11-23 NOTE — Progress Notes (Signed)
Devoria Glassing to be D/C'd  per MD order. Discussed with the patient and all questions fully answered.  VSS, Skin clean, dry and intact without evidence of skin break down, no evidence of skin tears noted.  IV catheter discontinued intact. Site without signs and symptoms of complications. Dressing and pressure applied.  An After Visit Summary was printed and given to the patient. Patient received prescription.  D/c education completed with patient/family including follow up instructions, medication list, d/c activities limitations if indicated, with other d/c instructions as indicated by MD - patient able to verbalize understanding, all questions fully answered.   Patient instructed to return to ED, call 911, or call MD for any changes in condition.   Patient to be escorted via WC, and D/C home via private auto.

## 2018-11-23 NOTE — Evaluation (Addendum)
Occupational Therapy Evaluation Patient Details Name: [name] MRN: [id number] DOB: [birth year] Today's Date: 11/23/2018    History of Present Illness Pt is a 63 y/o female s/p R THA (direct anterior) following fall that resulted in R hip fx. PMH includes DM and pt reported R knee surgery.    Clinical Impression   PTA, pt was living alone and was independent; sister has been staying with her since her fall/injury ~ a week ago. Currently, pt requires Min guard A for ADLs and functional mobility using RW. Provided education on LB ADLs, toilet transfer, and shower transfer with 3N1; pt demonstrated and verbalized understanding. Answered all pt questions. Recommend dc home with HHOT once medically stable per physician. All acute OT needs met and will sign off. Thank you.     Follow Up Recommendations  Home health OT;Supervision/Assistance - 24 hour    Equipment Recommendations  3 in 1 bedside commode    Recommendations for Other Services PT consult     Precautions / Restrictions Precautions Precautions: Fall Restrictions Weight Bearing Restrictions: Yes RLE Weight Bearing: Weight bearing as tolerated      Mobility Bed Mobility Overal bed mobility: Needs Assistance Bed Mobility: Supine to Sit     Supine to sit: Min guard;HOB elevated     General bed mobility comments: MIn GUard A for safety  Transfers Overall transfer level: Needs assistance Equipment used: Rolling walker (2 wheeled) Transfers: Sit to/from Stand Sit to Stand: Min guard         General transfer comment: Min GUard A for safety and increased time    Balance Overall balance assessment: Needs assistance Sitting-balance support: No upper extremity supported;Feet supported Sitting balance-Leahy Scale: Good     Standing balance support: Bilateral upper extremity supported;During functional activity Standing balance-Leahy Scale: Poor Standing balance comment: Reliant on BUE support.                             ADL either performed or assessed with clinical judgement   ADL Overall ADL's : Needs assistance/impaired Eating/Feeding: Independent;Sitting   Grooming: Set up;Sitting   Upper Body Bathing: Set up;Sitting   Lower Body Bathing: Min guard;Sit to/from stand   Upper Body Dressing : Set up;Sitting   Lower Body Dressing: Min guard;Sit to/from stand;Minimal assistance Lower Body Dressing Details (indicate cue type and reason): Educating pt on LB dressing and donning right leg first. Pt demonstrating understanding with simulation. Declining to get dress until she is finished with therapies. Min A for shoes Toilet Transfer: Min guard;RW;Ambulation(simulated in room) Toilet Transfer Details (indicate cue type and reason): Educating pt on use of 3N1 over toilet to increase safety       Tub/Shower Transfer Details (indicate cue type and reason): Providing education and demonstration of safe shower transfer with RW and 3N1. Pt verbalized understanding and declined to practice Functional mobility during ADLs: Min guard;Rolling walker General ADL Comments: Provided education on LB dressing, toileting, and shower trasnfer. Pt performing at [place] level. Presenting with increased anxiety.     Vision Baseline Vision/History: Wears glasses Patient Visual Report: No change from baseline       Perception     Praxis      Pertinent Vitals/Pain Pain Assessment: Faces Faces Pain Scale: Hurts little more Pain Location: R hip and R knee  Pain Descriptors / Indicators: Aching;Operative site guarding Pain Intervention(s): Limited activity within patient's tolerance;Monitored during session;Repositioned     Hand Dominance  Extremity/Trunk Assessment Upper Extremity Assessment Upper Extremity Assessment: Overall WFL for tasks assessed   Lower Extremity Assessment Lower Extremity Assessment: Defer to PT evaluation;RLE deficits/detail RLE Deficits / Details:  Deficits consistent with post op pain and weakness. Pt reports R knee pain at baseline, however, very concerned that something happened to it when she fell.    Cervical / Trunk Assessment Cervical / Trunk Assessment: Normal   Communication Communication Communication: No difficulties   Cognition Arousal/Alertness: Awake/alert Behavior During Therapy: Anxious Overall Cognitive Status: Within Functional Limits for tasks assessed                                 General Comments: Pt very anxious throughout session. Required extensive education about weight bearing status, no hip precautions, etc.    General Comments       Exercises     Shoulder Instructions      Home Living Family/patient expects to be discharged to:: Private residence Living Arrangements: Other relatives Available Help at Discharge: Family;Available 24 hours/day Type of Home: House Home Access: Stairs to enter [employer] of Steps: 2 Entrance Stairs-Rails: None Home Layout: Multi-level;Able to live on main level with bedroom/bathroom     Bathroom Shower/Tub: Tub/shower unit;Walk-in shower   Bathroom Toilet: Handicapped height     Home Equipment: Environmental consultant - 2 wheels;Cane - single point;Grab bars - tub/shower;Wheelchair - manual          Prior Functioning/Environment Level of Independence: Independent with assistive device(s)        Comments: Reports since her fall she was able to use RW some days and was using WC other days.         OT Problem List: Decreased strength;Decreased range of motion;Decreased activity tolerance;Impaired balance (sitting and/or standing);Decreased knowledge of use of DME or AE;Decreased knowledge of precautions;Pain      OT Treatment/Interventions:      OT Goals(Current goals can be found in the care plan section) Acute Rehab OT Goals Patient Stated Goal: to get better and go home  OT Goal Formulation: All assessment and education complete, DC  therapy  OT Frequency:     Barriers to D/C:            Co-evaluation PT/OT/SLP Co-Evaluation/Treatment: (Dove tail for pt comfort)            AM-PAC OT "6 Clicks" Daily Activity     Outcome Measure Help from another person eating meals?: None Help from another person taking care of personal grooming?: None Help from another person toileting, which includes using toliet, bedpan, or urinal?: A Little Help from another person bathing (including washing, rinsing, drying)?: A Little Help from another person to put on and taking off regular upper body clothing?: None Help from another person to put on and taking off regular lower body clothing?: A Little 6 Click Score: 21   End of Session Equipment Utilized During Treatment: Rolling walker Nurse Communication: Mobility status(DME needs)  Activity Tolerance: Patient tolerated treatment well Patient left: (in hallway with PTA)  OT Visit Diagnosis: Unsteadiness on feet (R26.81);Other abnormalities of gait and mobility (R26.89);Muscle weakness (generalized) (M62.81);Pain Pain - Right/Left: Right Pain - part of body: Leg                Time: 6503-5465 OT Time Calculation (min): 21 min Charges:  OT General Charges $OT Visit: 1 Visit OT Evaluation $OT Eval Moderate Complexity: 1 Mod  Takeshi Teasdale  MSOT, OTR/L Acute Rehab Pager: [phone] Office: [place] 11/23/2018, 12:16 PM

## 2018-11-23 NOTE — Discharge Summary (Signed)
Discharge Summary  Patient ID: [name] MRN: [id number] DOB/AGE: May 28, 1956 63 y.o.  Admit date: 11/22/2018 Discharge date: 11/23/2018  Admission Diagnoses:  Closed displaced fracture of right femoral neck [facility])  Discharge Diagnoses:  Principal Problem:   Closed displaced fracture of right femoral neck (HCC)   Past Medical History:  Diagnosis Date  . Cataract    right eye  . Diabetes mellitus without complication (HCC)    type 1  . GERD (gastroesophageal reflux disease)   . Glaucoma    right eye  . Heart murmur    never has caused any problems  . History of blood transfusion    with hysterectomy surgery  . Hypertension   . Neuropathy    patient denies this dx  . Seasonal allergies   . Ulcer of the stomach and intestine   . Wheelchair bound    temporary    Surgeries: Procedure(s): TOTAL HIP ARTHROPLASTY ANTERIOR APPROACH on 11/22/2018   Consultants (if any):   Discharged Condition: Improved  Hospital Course: [name] is an 63 y.o. female who was admitted 11/22/2018 with a diagnosis of Closed displaced fracture of right femoral neck (HCC) and went to the operating room on 11/22/2018 and underwent the above named procedures.    She was given perioperative antibiotics:  Anti-infectives (From admission, onward)   Start     Dose/Rate Route Frequency Ordered Stop   11/22/18 1400  ceFAZolin (ANCEF) IVPB 1 g/50 mL premix     1 g 100 mL/hr over 30 Minutes Intravenous Every 6 hours 11/22/18 1058 11/22/18 2151   11/22/18 0600  ceFAZolin (ANCEF) IVPB 2g/100 mL premix     2 g 200 mL/hr over 30 Minutes Intravenous On call to O.R. 11/22/18 8657 11/22/18 0752    .  She was given sequential compression devices, early ambulation, and Aspirin for DVT prophylaxis.  She benefited maximally from the hospital stay and there were no complications.    Recent vital signs:  Vitals:   11/23/18 0212 11/23/18 0548  BP: (!) 152/73 (!) 146/65  Pulse: 92 78  Resp: 19 16  Temp:  98.2 F (36.8 C) 98.5 F (36.9 C)  SpO2: 100% 100%    Recent laboratory studies:  Lab Results  Component Value Date   HGB 13.3 11/21/2018   HGB 12.1 01/14/2017   HGB 12.8 06/15/2014   Lab Results  Component Value Date   WBC 8.5 11/21/2018   PLT 313 11/21/2018   Lab Results  Component Value Date   INR 0.96 07/07/2010   Lab Results  Component Value Date   NA 138 11/21/2018   K 3.8 11/21/2018   CL 102 11/21/2018   CO2 26 11/21/2018   BUN 14 11/21/2018   CREATININE 0.78 11/21/2018   GLUCOSE 256 (H) 11/21/2018    Discharge Medications:   Allergies as of 11/23/2018      Reactions   Ciprofloxacin Other (See Comments)   Breathing problems   Cephalosporins Other (See Comments)   Headache    Garlic Diarrhea, Nausea And Vomiting   Macrobid [nitrofurantoin] Other (See Comments)   Headaches    Onion Diarrhea, Nausea And Vomiting   Sulfa Antibiotics Rash   Sensitive to light      Medication List    STOP taking these medications   acetaminophen 500 MG tablet Commonly known as:  TYLENOL     TAKE these medications   ALPRAZolam 0.5 MG tablet Commonly known as:  XANAX Take 0.25 mg by mouth as needed for  anxiety.   aluminum-magnesium hydroxide-simethicone 200-200-20 MG/5ML Susp Commonly known as:  MAALOX Take 30 mLs by mouth as needed. Can take up to 3 times a day   amLODipine 2.5 MG tablet Commonly known as:  NORVASC Take 2.5 mg by mouth at bedtime.   aspirin EC 81 MG tablet Take 1 tablet (81 mg total) by mouth 2 (two) times daily. For DVT prophylaxis for 30 days after surgery.   dorzolamide-timolol 22.3-6.8 MG/ML ophthalmic solution Commonly known as:  COSOPT Place 1 drop into both eyes 2 (two) times daily.   ergocalciferol 1.25 MG (50000 UT) capsule Commonly known as:  VITAMIN D2 Take 50,000 Units by mouth every Wednesday.   gabapentin 100 MG capsule Commonly known as:  Neurontin Take 1 capsule (100 mg total) by mouth 3 (three) times daily as needed for  up to 10 days (Pain). For pain.   HYDROcodone-acetaminophen 5-325 MG tablet Commonly known as:  Norco Take 1-2 tablets by mouth every 6 (six) hours as needed for up to 5 days for severe pain (Take Tylenol instead for mild pain.).   insulin aspart 100 UNIT/ML injection Commonly known as:  novoLOG Inject 5-8 Units into the skin 3 (three) times daily before meals.   insulin glargine 100 UNIT/ML injection Commonly known as:  LANTUS Inject 9 Units into the skin at bedtime.   methocarbamol 500 MG tablet Commonly known as:  Robaxin Take 1 tablet (500 mg total) by mouth every 8 (eight) hours as needed for muscle spasms.   ondansetron 4 MG tablet Commonly known as:  Zofran Take 1 tablet (4 mg total) by mouth every 8 (eight) hours as needed for nausea or vomiting.   PriLOSEC OTC 20 MG tablet Generic drug:  omeprazole Take 40 mg by mouth daily.   quinapril 40 MG tablet Commonly known as:  ACCUPRIL Take 40 mg by mouth at bedtime.   sucralfate 1 g tablet Commonly known as:  Carafate Take 1 tablet (1 g total) by mouth 4 (four) times daily.       Diagnostic Studies: Dg C-arm 1-60 Min  Result Date: 11/22/2018 CLINICAL DATA:  Right hip replacement EXAM: OPERATIVE RIGHT HIP WITH PELVIS; DG C-ARM 61-120 MIN COMPARISON:  None. FLUOROSCOPY TIME:  Radiation Exposure Index (as provided by the fluoroscopic device): Not available If the device does not provide the exposure index: Fluoroscopy Time:  17 seconds Number of Acquired Images:  2 FINDINGS: Right hip prosthesis is noted in satisfactory position. No acute bony or soft tissue abnormality is noted. IMPRESSION: Status post right hip replacement. Electronically Signed   By: Alcide CleverMark  Lukens M.D.   On: 11/22/2018 09:18   Dg Hip Operative Unilat W Or W/o Pelvis Right  Result Date: 11/22/2018 CLINICAL DATA:  Right hip replacement EXAM: OPERATIVE RIGHT HIP WITH PELVIS; DG C-ARM 61-120 MIN COMPARISON:  None. FLUOROSCOPY TIME:  Radiation Exposure Index (as  provided by the fluoroscopic device): Not available If the device does not provide the exposure index: Fluoroscopy Time:  17 seconds Number of Acquired Images:  2 FINDINGS: Right hip prosthesis is noted in satisfactory position. No acute bony or soft tissue abnormality is noted. IMPRESSION: Status post right hip replacement. Electronically Signed   By: Alcide CleverMark  Lukens M.D.   On: 11/22/2018 09:18    Disposition: Discharge disposition: 01-Home or Self Care       Discharge Instructions    Discharge patient   Complete by:  As directed    Okay to Discharge IF: Mobilizing safely with therapy.  Discharge disposition:  01-Home or Self Care   Discharge patient date:  11/23/2018      Follow-up Information    Sheral Apley, MD.   Specialty:  Orthopedic Surgery Contact information: [address] [place] [address] [phone]            Signed: Albina Billet III PA-C 11/23/2018, 8:18 AM

## 2018-11-23 NOTE — Progress Notes (Signed)
Physical Therapy Treatment Patient Details Name: [name] MRN: [id number] DOB: 03/31/1956 Today's Date: 11/23/2018    History of Present Illness Pt is a 63 y/o female s/p R THA (direct anterior) following fall that resulted in R hip fx. PMH includes DM and pt reported R knee surgery.     PT Comments    Patient seen for mobility progression. Pt overall is supervision/min guard for OOB this session. Pt tolerated ambulating 120 ft. Pt refused to practice stairs despite max encouragement so pt verbally educated on safest technique/sequencing to get into home.  Current plan remains appropriate.    Follow Up Recommendations  Home health PT;Follow surgeon's recommendation for DC plan and follow-up therapies;Supervision for mobility/OOB     Equipment Recommendations  3in1 (PT)    Recommendations for Other Services       Precautions / Restrictions Precautions Precautions: Fall Restrictions Weight Bearing Restrictions: Yes RLE Weight Bearing: Weight bearing as tolerated    Mobility  Bed Mobility Overal bed mobility: Needs Assistance Bed Mobility: Supine to Sit     Supine to sit: Min guard;HOB elevated     General bed mobility comments: pt sitting with OT upon arrival  Transfers Overall transfer level: Needs assistance Equipment used: Rolling walker (2 wheeled) Transfers: Sit to/from Stand Sit to Stand: Min guard         General transfer comment: for safety  Ambulation/Gait Ambulation/Gait assistance: Supervision Gait Distance (Feet): 120 Feet Assistive device: Rolling walker (2 wheeled) Gait Pattern/deviations: Decreased step length - left;Decreased weight shift to right;Antalgic;Step-to pattern;Decreased stance time - right Gait velocity: Decreased    General Gait Details: mildly antalgic gait pattern with improving step through; cues for sequencing   Stairs         General stair comments: pt refused to practice stairs despit max encouragement; stair  training verbally reviewed with pt in room   Wheelchair Mobility    Modified Rankin (Stroke Patients Only)       Balance Overall balance assessment: Needs assistance Sitting-balance support: No upper extremity supported;Feet supported Sitting balance-Leahy Scale: Good     Standing balance support: Bilateral upper extremity supported;During functional activity Standing balance-Leahy Scale: Poor Standing balance comment: Reliant on BUE support.                             Cognition Arousal/Alertness: Awake/alert Behavior During Therapy: Anxious Overall Cognitive Status: Within Functional Limits for tasks assessed                                 General Comments: pt appears anxious about mobility despite moving well and not very receptive to education      Exercises      General Comments General comments (skin integrity, edema, etc.): pt given HEP handout       Pertinent Vitals/Pain Pain Assessment: Faces Faces Pain Scale: Hurts little more Pain Location: R hip and R knee  Pain Descriptors / Indicators: Guarding;Sore Pain Intervention(s): Limited activity within patient's tolerance;Monitored during session;Repositioned    Home Living Family/patient expects to be discharged to:: Private residence Living Arrangements: Other relatives Available Help at Discharge: Family;Available 24 hours/day Type of Home: House Home Access: Stairs to enter Entrance Stairs-Rails: None Home Layout: Multi-level;Able to live on main level with bedroom/bathroom Home Equipment: Dan HumphreysWalker - 2 wheels;Cane - single point;Grab bars - tub/shower;Wheelchair - manual      Prior  Function Level of Independence: Independent with assistive device(s)      Comments: Reports since her fall she was able to use RW some days and was using WC other days.    PT Goals (current goals can now be found in the care plan section) Acute Rehab PT Goals Patient Stated Goal: to get better  and go home  Progress towards PT goals: Progressing toward goals    Frequency    7X/week      PT Plan Current plan remains appropriate    Co-evaluation              AM-PAC PT "6 Clicks" Mobility   Outcome Measure  Help needed turning from your back to your side while in a flat bed without using bedrails?: A Little Help needed moving from lying on your back to sitting on the side of a flat bed without using bedrails?: A Little Help needed moving to and from a bed to a chair (including a wheelchair)?: A Little Help needed standing up from a chair using your arms (e.g., wheelchair or bedside chair)?: A Little Help needed to walk in hospital room?: A Little Help needed climbing 3-5 steps with a railing? : A Little 6 Click Score: 18    End of Session Equipment Utilized During Treatment: Gait belt Activity Tolerance: Patient tolerated treatment well Patient left: in chair;with call bell/phone within reach Nurse Communication: Mobility status PT Visit Diagnosis: Other abnormalities of gait and mobility (R26.89);Pain;History of falling (Z91.81) Pain - Right/Left: Right Pain - part of body: Hip;Knee     Time: 4718-5501 PT Time Calculation (min) (ACUTE ONLY): 34 min  Charges:  $Gait Training: 23-37 mins                     Erline Levine, PTA Acute Rehabilitation Services Pager: [phone] Office: [phone]     Carolynne Edouard 11/23/2018, 12:28 PM

## 2018-11-23 NOTE — Progress Notes (Signed)
    Subjective: Patient reports pain as mild.  Tolerating diet.  Urinating - foley still in place.  OOB walking in room and to chair.  Objective:   VITALS:   Vitals:   11/22/18 1340 11/22/18 2144 11/23/18 0212 11/23/18 0548  BP: (!) 146/67 (!) 154/71 (!) 152/73 (!) 146/65  Pulse: 98 (!) 102 92 78  Resp: 16 18 19 16   Temp: 98 F (36.7 C) 98.4 F (36.9 C) 98.2 F (36.8 C) 98.5 F (36.9 C)  TempSrc: Oral Oral Oral Oral  SpO2: 98% 98% 100% 100%  Weight:      Height:       CBC Latest Ref Rng & Units 11/21/2018 01/14/2017 06/15/2014  WBC 4.0 - 10.5 K/uL 8.5 8.5 7.7  Hemoglobin 12.0 - 15.0 g/dL 96.2 22.9 79.8  Hematocrit 36.0 - 46.0 % 42.7 35.8(L) 39.1  Platelets 150 - 400 K/uL 313 218 229   BMP Latest Ref Rng & Units 11/21/2018 01/14/2017 06/15/2014  Glucose 70 - 99 mg/dL 921(J) 941(D) 408(X)  BUN 8 - 23 mg/dL 14 8 16   Creatinine 0.44 - 1.00 mg/dL 4.48 1.85 6.31  Sodium 135 - 145 mmol/L 138 130(L) 134(L)  Potassium 3.5 - 5.1 mmol/L 3.8 3.4(L) 4.2  Chloride 98 - 111 mmol/L 102 94(L) 93(L)  CO2 22 - 32 mmol/L 26 26 27   Calcium 8.9 - 10.3 mg/dL 9.1 9.3 9.7   Intake/Output      04/07 0701 - 04/08 0700 04/08 0701 - 04/09 0700   P.O. 100    I.V. (mL/kg) 1920.9 (29.4)    IV Piggyback 250    Total Intake(mL/kg) 2270.9 (34.8)    Urine (mL/kg/hr) 1450 (0.9)    Blood 100    Total Output 1550    Net +720.9            Physical Exam: General: NAD.  Upright in bed on arrival.  Calm, conversant. Resp: No increased wob Cardio: regular rate and rhythm ABD soft Neurologically intact MSK RLE: Neurovascularly intact Sensation intact distally Feet warm Dorsiflexion/Plantar flexion intact Incision: dressing C/D/I   Assessment: 1 Day Post-Op  S/P Procedure(s) (LRB): TOTAL HIP ARTHROPLASTY ANTERIOR APPROACH (Right) by Dr. Jewel Baize. [name] on 11/22/2018  Active Problems:   Closed displaced fracture of right femoral neck (HCC)   Closed displaced right femoral neck fracture,  status post total hip arthroplasty Doing well postop day 1 Eating, drinking, and voiding Pain controlled Good early mobilization in room  Plan: Up with therapy Incentive Spirometry Apply ice PRN -Discussed options for DVT prophylaxis including risks and benefits of various options.  Patient has a remote history of ulcer and is on omeprazole for reflux/gastric protection.  She verbalizes that she does not want to perform injections nor take any NOACs.  She will plan to take 81 mg aspirin twice daily for 30 days.   Weight Bearing: Weight Bearing as Tolerated (WBAT) RLE Dressings: Maintain Mepilex.   VTE prophylaxis: Aspirin, SCDs, ambulation Dispo: Home likely later today after therapy sessions.  Our office will arrange home therapy.   Lucretia Kern Martensen III, PA-C 11/23/2018, 8:09 AM

## 2018-11-28 DIAGNOSIS — S72001D Fracture of unspecified part of neck of right femur, subsequent encounter for closed fracture with routine healing: Secondary | ICD-10-CM | POA: Diagnosis not present

## 2018-11-28 DIAGNOSIS — Z96641 Presence of right artificial hip joint: Secondary | ICD-10-CM | POA: Diagnosis not present

## 2018-11-28 DIAGNOSIS — H3322 Serous retinal detachment, left eye: Secondary | ICD-10-CM | POA: Diagnosis not present

## 2018-11-28 DIAGNOSIS — K219 Gastro-esophageal reflux disease without esophagitis: Secondary | ICD-10-CM | POA: Diagnosis not present

## 2018-11-28 DIAGNOSIS — Z9181 History of falling: Secondary | ICD-10-CM | POA: Diagnosis not present

## 2018-11-28 DIAGNOSIS — H409 Unspecified glaucoma: Secondary | ICD-10-CM | POA: Diagnosis not present

## 2018-11-28 DIAGNOSIS — E1040 Type 1 diabetes mellitus with diabetic neuropathy, unspecified: Secondary | ICD-10-CM | POA: Diagnosis not present

## 2018-11-28 DIAGNOSIS — I10 Essential (primary) hypertension: Secondary | ICD-10-CM | POA: Diagnosis not present

## 2018-11-29 DIAGNOSIS — S72001D Fracture of unspecified part of neck of right femur, subsequent encounter for closed fracture with routine healing: Secondary | ICD-10-CM | POA: Diagnosis not present

## 2018-11-29 DIAGNOSIS — Z96641 Presence of right artificial hip joint: Secondary | ICD-10-CM | POA: Diagnosis not present

## 2018-11-29 DIAGNOSIS — K219 Gastro-esophageal reflux disease without esophagitis: Secondary | ICD-10-CM | POA: Diagnosis not present

## 2018-11-29 DIAGNOSIS — E1040 Type 1 diabetes mellitus with diabetic neuropathy, unspecified: Secondary | ICD-10-CM | POA: Diagnosis not present

## 2018-11-29 DIAGNOSIS — I10 Essential (primary) hypertension: Secondary | ICD-10-CM | POA: Diagnosis not present

## 2018-11-29 DIAGNOSIS — Z9181 History of falling: Secondary | ICD-10-CM | POA: Diagnosis not present

## 2018-11-29 DIAGNOSIS — H3322 Serous retinal detachment, left eye: Secondary | ICD-10-CM | POA: Diagnosis not present

## 2018-11-29 DIAGNOSIS — H409 Unspecified glaucoma: Secondary | ICD-10-CM | POA: Diagnosis not present

## 2018-12-01 DIAGNOSIS — S72001D Fracture of unspecified part of neck of right femur, subsequent encounter for closed fracture with routine healing: Secondary | ICD-10-CM | POA: Diagnosis not present

## 2018-12-01 DIAGNOSIS — Z9181 History of falling: Secondary | ICD-10-CM | POA: Diagnosis not present

## 2018-12-01 DIAGNOSIS — K219 Gastro-esophageal reflux disease without esophagitis: Secondary | ICD-10-CM | POA: Diagnosis not present

## 2018-12-01 DIAGNOSIS — E1040 Type 1 diabetes mellitus with diabetic neuropathy, unspecified: Secondary | ICD-10-CM | POA: Diagnosis not present

## 2018-12-01 DIAGNOSIS — I10 Essential (primary) hypertension: Secondary | ICD-10-CM | POA: Diagnosis not present

## 2018-12-01 DIAGNOSIS — Z96641 Presence of right artificial hip joint: Secondary | ICD-10-CM | POA: Diagnosis not present

## 2018-12-01 DIAGNOSIS — H3322 Serous retinal detachment, left eye: Secondary | ICD-10-CM | POA: Diagnosis not present

## 2018-12-01 DIAGNOSIS — H409 Unspecified glaucoma: Secondary | ICD-10-CM | POA: Diagnosis not present

## 2018-12-02 DIAGNOSIS — K219 Gastro-esophageal reflux disease without esophagitis: Secondary | ICD-10-CM | POA: Diagnosis not present

## 2018-12-02 DIAGNOSIS — H3520 Other non-diabetic proliferative retinopathy, unspecified eye: Secondary | ICD-10-CM | POA: Diagnosis not present

## 2018-12-02 DIAGNOSIS — E1040 Type 1 diabetes mellitus with diabetic neuropathy, unspecified: Secondary | ICD-10-CM | POA: Diagnosis not present

## 2018-12-02 DIAGNOSIS — Z9181 History of falling: Secondary | ICD-10-CM | POA: Diagnosis not present

## 2018-12-02 DIAGNOSIS — I10 Essential (primary) hypertension: Secondary | ICD-10-CM | POA: Diagnosis not present

## 2018-12-02 DIAGNOSIS — H33002 Unspecified retinal detachment with retinal break, left eye: Secondary | ICD-10-CM | POA: Diagnosis not present

## 2018-12-02 DIAGNOSIS — H409 Unspecified glaucoma: Secondary | ICD-10-CM | POA: Diagnosis not present

## 2018-12-02 DIAGNOSIS — H3322 Serous retinal detachment, left eye: Secondary | ICD-10-CM | POA: Diagnosis not present

## 2018-12-02 DIAGNOSIS — Z96641 Presence of right artificial hip joint: Secondary | ICD-10-CM | POA: Diagnosis not present

## 2018-12-02 DIAGNOSIS — S72001D Fracture of unspecified part of neck of right femur, subsequent encounter for closed fracture with routine healing: Secondary | ICD-10-CM | POA: Diagnosis not present

## 2018-12-02 DIAGNOSIS — I5189 Other ill-defined heart diseases: Secondary | ICD-10-CM | POA: Diagnosis not present

## 2018-12-05 DIAGNOSIS — H3322 Serous retinal detachment, left eye: Secondary | ICD-10-CM | POA: Diagnosis not present

## 2018-12-05 DIAGNOSIS — S72001D Fracture of unspecified part of neck of right femur, subsequent encounter for closed fracture with routine healing: Secondary | ICD-10-CM | POA: Diagnosis not present

## 2018-12-05 DIAGNOSIS — Z96641 Presence of right artificial hip joint: Secondary | ICD-10-CM | POA: Diagnosis not present

## 2018-12-05 DIAGNOSIS — Z9181 History of falling: Secondary | ICD-10-CM | POA: Diagnosis not present

## 2018-12-05 DIAGNOSIS — H409 Unspecified glaucoma: Secondary | ICD-10-CM | POA: Diagnosis not present

## 2018-12-05 DIAGNOSIS — K219 Gastro-esophageal reflux disease without esophagitis: Secondary | ICD-10-CM | POA: Diagnosis not present

## 2018-12-05 DIAGNOSIS — I10 Essential (primary) hypertension: Secondary | ICD-10-CM | POA: Diagnosis not present

## 2018-12-05 DIAGNOSIS — E1040 Type 1 diabetes mellitus with diabetic neuropathy, unspecified: Secondary | ICD-10-CM | POA: Diagnosis not present

## 2018-12-07 DIAGNOSIS — Z96641 Presence of right artificial hip joint: Secondary | ICD-10-CM | POA: Diagnosis not present

## 2018-12-07 DIAGNOSIS — H3322 Serous retinal detachment, left eye: Secondary | ICD-10-CM | POA: Diagnosis not present

## 2018-12-07 DIAGNOSIS — E1040 Type 1 diabetes mellitus with diabetic neuropathy, unspecified: Secondary | ICD-10-CM | POA: Diagnosis not present

## 2018-12-07 DIAGNOSIS — S72041D Displaced fracture of base of neck of right femur, subsequent encounter for closed fracture with routine healing: Secondary | ICD-10-CM | POA: Diagnosis not present

## 2018-12-07 DIAGNOSIS — H409 Unspecified glaucoma: Secondary | ICD-10-CM | POA: Diagnosis not present

## 2018-12-07 DIAGNOSIS — I10 Essential (primary) hypertension: Secondary | ICD-10-CM | POA: Diagnosis not present

## 2018-12-07 DIAGNOSIS — S72001D Fracture of unspecified part of neck of right femur, subsequent encounter for closed fracture with routine healing: Secondary | ICD-10-CM | POA: Diagnosis not present

## 2018-12-07 DIAGNOSIS — Z9181 History of falling: Secondary | ICD-10-CM | POA: Diagnosis not present

## 2018-12-07 DIAGNOSIS — K219 Gastro-esophageal reflux disease without esophagitis: Secondary | ICD-10-CM | POA: Diagnosis not present

## 2018-12-09 DIAGNOSIS — H3322 Serous retinal detachment, left eye: Secondary | ICD-10-CM | POA: Diagnosis not present

## 2018-12-09 DIAGNOSIS — S72001D Fracture of unspecified part of neck of right femur, subsequent encounter for closed fracture with routine healing: Secondary | ICD-10-CM | POA: Diagnosis not present

## 2018-12-09 DIAGNOSIS — H409 Unspecified glaucoma: Secondary | ICD-10-CM | POA: Diagnosis not present

## 2018-12-09 DIAGNOSIS — K219 Gastro-esophageal reflux disease without esophagitis: Secondary | ICD-10-CM | POA: Diagnosis not present

## 2018-12-09 DIAGNOSIS — E1040 Type 1 diabetes mellitus with diabetic neuropathy, unspecified: Secondary | ICD-10-CM | POA: Diagnosis not present

## 2018-12-09 DIAGNOSIS — I10 Essential (primary) hypertension: Secondary | ICD-10-CM | POA: Diagnosis not present

## 2018-12-09 DIAGNOSIS — Z96641 Presence of right artificial hip joint: Secondary | ICD-10-CM | POA: Diagnosis not present

## 2018-12-09 DIAGNOSIS — Z9181 History of falling: Secondary | ICD-10-CM | POA: Diagnosis not present

## 2018-12-12 DIAGNOSIS — S72001D Fracture of unspecified part of neck of right femur, subsequent encounter for closed fracture with routine healing: Secondary | ICD-10-CM | POA: Diagnosis not present

## 2018-12-12 DIAGNOSIS — K219 Gastro-esophageal reflux disease without esophagitis: Secondary | ICD-10-CM | POA: Diagnosis not present

## 2018-12-12 DIAGNOSIS — I10 Essential (primary) hypertension: Secondary | ICD-10-CM | POA: Diagnosis not present

## 2018-12-12 DIAGNOSIS — H409 Unspecified glaucoma: Secondary | ICD-10-CM | POA: Diagnosis not present

## 2018-12-12 DIAGNOSIS — Z9181 History of falling: Secondary | ICD-10-CM | POA: Diagnosis not present

## 2018-12-12 DIAGNOSIS — Z96641 Presence of right artificial hip joint: Secondary | ICD-10-CM | POA: Diagnosis not present

## 2018-12-12 DIAGNOSIS — E1040 Type 1 diabetes mellitus with diabetic neuropathy, unspecified: Secondary | ICD-10-CM | POA: Diagnosis not present

## 2018-12-12 DIAGNOSIS — H3322 Serous retinal detachment, left eye: Secondary | ICD-10-CM | POA: Diagnosis not present

## 2018-12-14 DIAGNOSIS — I10 Essential (primary) hypertension: Secondary | ICD-10-CM | POA: Diagnosis not present

## 2018-12-14 DIAGNOSIS — S72001D Fracture of unspecified part of neck of right femur, subsequent encounter for closed fracture with routine healing: Secondary | ICD-10-CM | POA: Diagnosis not present

## 2018-12-14 DIAGNOSIS — H409 Unspecified glaucoma: Secondary | ICD-10-CM | POA: Diagnosis not present

## 2018-12-14 DIAGNOSIS — K219 Gastro-esophageal reflux disease without esophagitis: Secondary | ICD-10-CM | POA: Diagnosis not present

## 2018-12-14 DIAGNOSIS — Z96641 Presence of right artificial hip joint: Secondary | ICD-10-CM | POA: Diagnosis not present

## 2018-12-14 DIAGNOSIS — E1040 Type 1 diabetes mellitus with diabetic neuropathy, unspecified: Secondary | ICD-10-CM | POA: Diagnosis not present

## 2018-12-14 DIAGNOSIS — Z9181 History of falling: Secondary | ICD-10-CM | POA: Diagnosis not present

## 2018-12-14 DIAGNOSIS — H3322 Serous retinal detachment, left eye: Secondary | ICD-10-CM | POA: Diagnosis not present

## 2018-12-16 DIAGNOSIS — S72001D Fracture of unspecified part of neck of right femur, subsequent encounter for closed fracture with routine healing: Secondary | ICD-10-CM | POA: Diagnosis not present

## 2018-12-16 DIAGNOSIS — Z96641 Presence of right artificial hip joint: Secondary | ICD-10-CM | POA: Diagnosis not present

## 2018-12-16 DIAGNOSIS — H3322 Serous retinal detachment, left eye: Secondary | ICD-10-CM | POA: Diagnosis not present

## 2018-12-16 DIAGNOSIS — E1040 Type 1 diabetes mellitus with diabetic neuropathy, unspecified: Secondary | ICD-10-CM | POA: Diagnosis not present

## 2018-12-16 DIAGNOSIS — H409 Unspecified glaucoma: Secondary | ICD-10-CM | POA: Diagnosis not present

## 2018-12-16 DIAGNOSIS — Z9181 History of falling: Secondary | ICD-10-CM | POA: Diagnosis not present

## 2018-12-16 DIAGNOSIS — I10 Essential (primary) hypertension: Secondary | ICD-10-CM | POA: Diagnosis not present

## 2018-12-16 DIAGNOSIS — K219 Gastro-esophageal reflux disease without esophagitis: Secondary | ICD-10-CM | POA: Diagnosis not present

## 2018-12-21 DIAGNOSIS — Z96641 Presence of right artificial hip joint: Secondary | ICD-10-CM | POA: Diagnosis not present

## 2018-12-21 DIAGNOSIS — E1040 Type 1 diabetes mellitus with diabetic neuropathy, unspecified: Secondary | ICD-10-CM | POA: Diagnosis not present

## 2018-12-21 DIAGNOSIS — Z9181 History of falling: Secondary | ICD-10-CM | POA: Diagnosis not present

## 2018-12-21 DIAGNOSIS — I10 Essential (primary) hypertension: Secondary | ICD-10-CM | POA: Diagnosis not present

## 2018-12-21 DIAGNOSIS — H3322 Serous retinal detachment, left eye: Secondary | ICD-10-CM | POA: Diagnosis not present

## 2018-12-21 DIAGNOSIS — S72001D Fracture of unspecified part of neck of right femur, subsequent encounter for closed fracture with routine healing: Secondary | ICD-10-CM | POA: Diagnosis not present

## 2018-12-21 DIAGNOSIS — H409 Unspecified glaucoma: Secondary | ICD-10-CM | POA: Diagnosis not present

## 2018-12-21 DIAGNOSIS — K219 Gastro-esophageal reflux disease without esophagitis: Secondary | ICD-10-CM | POA: Diagnosis not present

## 2018-12-26 DIAGNOSIS — Z9181 History of falling: Secondary | ICD-10-CM | POA: Diagnosis not present

## 2018-12-26 DIAGNOSIS — S72001D Fracture of unspecified part of neck of right femur, subsequent encounter for closed fracture with routine healing: Secondary | ICD-10-CM | POA: Diagnosis not present

## 2018-12-26 DIAGNOSIS — H3322 Serous retinal detachment, left eye: Secondary | ICD-10-CM | POA: Diagnosis not present

## 2018-12-26 DIAGNOSIS — H409 Unspecified glaucoma: Secondary | ICD-10-CM | POA: Diagnosis not present

## 2018-12-26 DIAGNOSIS — E1040 Type 1 diabetes mellitus with diabetic neuropathy, unspecified: Secondary | ICD-10-CM | POA: Diagnosis not present

## 2018-12-26 DIAGNOSIS — I10 Essential (primary) hypertension: Secondary | ICD-10-CM | POA: Diagnosis not present

## 2018-12-26 DIAGNOSIS — K219 Gastro-esophageal reflux disease without esophagitis: Secondary | ICD-10-CM | POA: Diagnosis not present

## 2018-12-26 DIAGNOSIS — Z96641 Presence of right artificial hip joint: Secondary | ICD-10-CM | POA: Diagnosis not present

## 2018-12-28 DIAGNOSIS — E1040 Type 1 diabetes mellitus with diabetic neuropathy, unspecified: Secondary | ICD-10-CM | POA: Diagnosis not present

## 2018-12-28 DIAGNOSIS — K219 Gastro-esophageal reflux disease without esophagitis: Secondary | ICD-10-CM | POA: Diagnosis not present

## 2018-12-28 DIAGNOSIS — S72001D Fracture of unspecified part of neck of right femur, subsequent encounter for closed fracture with routine healing: Secondary | ICD-10-CM | POA: Diagnosis not present

## 2018-12-28 DIAGNOSIS — Z96641 Presence of right artificial hip joint: Secondary | ICD-10-CM | POA: Diagnosis not present

## 2018-12-28 DIAGNOSIS — I10 Essential (primary) hypertension: Secondary | ICD-10-CM | POA: Diagnosis not present

## 2018-12-28 DIAGNOSIS — H409 Unspecified glaucoma: Secondary | ICD-10-CM | POA: Diagnosis not present

## 2018-12-28 DIAGNOSIS — Z9181 History of falling: Secondary | ICD-10-CM | POA: Diagnosis not present

## 2018-12-28 DIAGNOSIS — H3322 Serous retinal detachment, left eye: Secondary | ICD-10-CM | POA: Diagnosis not present

## 2019-01-02 DIAGNOSIS — H409 Unspecified glaucoma: Secondary | ICD-10-CM | POA: Diagnosis not present

## 2019-01-02 DIAGNOSIS — E1040 Type 1 diabetes mellitus with diabetic neuropathy, unspecified: Secondary | ICD-10-CM | POA: Diagnosis not present

## 2019-01-02 DIAGNOSIS — H3322 Serous retinal detachment, left eye: Secondary | ICD-10-CM | POA: Diagnosis not present

## 2019-01-02 DIAGNOSIS — Z96641 Presence of right artificial hip joint: Secondary | ICD-10-CM | POA: Diagnosis not present

## 2019-01-02 DIAGNOSIS — S72001D Fracture of unspecified part of neck of right femur, subsequent encounter for closed fracture with routine healing: Secondary | ICD-10-CM | POA: Diagnosis not present

## 2019-01-02 DIAGNOSIS — Z9181 History of falling: Secondary | ICD-10-CM | POA: Diagnosis not present

## 2019-01-02 DIAGNOSIS — K219 Gastro-esophageal reflux disease without esophagitis: Secondary | ICD-10-CM | POA: Diagnosis not present

## 2019-01-02 DIAGNOSIS — I10 Essential (primary) hypertension: Secondary | ICD-10-CM | POA: Diagnosis not present

## 2019-01-06 DIAGNOSIS — Z96641 Presence of right artificial hip joint: Secondary | ICD-10-CM | POA: Diagnosis not present

## 2019-01-06 DIAGNOSIS — E1040 Type 1 diabetes mellitus with diabetic neuropathy, unspecified: Secondary | ICD-10-CM | POA: Diagnosis not present

## 2019-01-06 DIAGNOSIS — Z9181 History of falling: Secondary | ICD-10-CM | POA: Diagnosis not present

## 2019-01-06 DIAGNOSIS — H3322 Serous retinal detachment, left eye: Secondary | ICD-10-CM | POA: Diagnosis not present

## 2019-01-06 DIAGNOSIS — I10 Essential (primary) hypertension: Secondary | ICD-10-CM | POA: Diagnosis not present

## 2019-01-06 DIAGNOSIS — H409 Unspecified glaucoma: Secondary | ICD-10-CM | POA: Diagnosis not present

## 2019-01-06 DIAGNOSIS — K219 Gastro-esophageal reflux disease without esophagitis: Secondary | ICD-10-CM | POA: Diagnosis not present

## 2019-01-06 DIAGNOSIS — S72001D Fracture of unspecified part of neck of right femur, subsequent encounter for closed fracture with routine healing: Secondary | ICD-10-CM | POA: Diagnosis not present

## 2019-01-11 DIAGNOSIS — H409 Unspecified glaucoma: Secondary | ICD-10-CM | POA: Diagnosis not present

## 2019-01-11 DIAGNOSIS — E1040 Type 1 diabetes mellitus with diabetic neuropathy, unspecified: Secondary | ICD-10-CM | POA: Diagnosis not present

## 2019-01-11 DIAGNOSIS — S72001D Fracture of unspecified part of neck of right femur, subsequent encounter for closed fracture with routine healing: Secondary | ICD-10-CM | POA: Diagnosis not present

## 2019-01-11 DIAGNOSIS — I10 Essential (primary) hypertension: Secondary | ICD-10-CM | POA: Diagnosis not present

## 2019-01-11 DIAGNOSIS — Z96641 Presence of right artificial hip joint: Secondary | ICD-10-CM | POA: Diagnosis not present

## 2019-01-11 DIAGNOSIS — H3322 Serous retinal detachment, left eye: Secondary | ICD-10-CM | POA: Diagnosis not present

## 2019-01-11 DIAGNOSIS — K219 Gastro-esophageal reflux disease without esophagitis: Secondary | ICD-10-CM | POA: Diagnosis not present

## 2019-01-11 DIAGNOSIS — Z9181 History of falling: Secondary | ICD-10-CM | POA: Diagnosis not present

## 2019-01-13 DIAGNOSIS — Z96641 Presence of right artificial hip joint: Secondary | ICD-10-CM | POA: Diagnosis not present

## 2019-01-13 DIAGNOSIS — H3322 Serous retinal detachment, left eye: Secondary | ICD-10-CM | POA: Diagnosis not present

## 2019-01-13 DIAGNOSIS — I10 Essential (primary) hypertension: Secondary | ICD-10-CM | POA: Diagnosis not present

## 2019-01-13 DIAGNOSIS — S72001D Fracture of unspecified part of neck of right femur, subsequent encounter for closed fracture with routine healing: Secondary | ICD-10-CM | POA: Diagnosis not present

## 2019-01-13 DIAGNOSIS — E1040 Type 1 diabetes mellitus with diabetic neuropathy, unspecified: Secondary | ICD-10-CM | POA: Diagnosis not present

## 2019-01-13 DIAGNOSIS — K219 Gastro-esophageal reflux disease without esophagitis: Secondary | ICD-10-CM | POA: Diagnosis not present

## 2019-01-13 DIAGNOSIS — H409 Unspecified glaucoma: Secondary | ICD-10-CM | POA: Diagnosis not present

## 2019-01-13 DIAGNOSIS — Z9181 History of falling: Secondary | ICD-10-CM | POA: Diagnosis not present

## 2019-01-16 DIAGNOSIS — H3322 Serous retinal detachment, left eye: Secondary | ICD-10-CM | POA: Diagnosis not present

## 2019-01-16 DIAGNOSIS — K219 Gastro-esophageal reflux disease without esophagitis: Secondary | ICD-10-CM | POA: Diagnosis not present

## 2019-01-16 DIAGNOSIS — Z96641 Presence of right artificial hip joint: Secondary | ICD-10-CM | POA: Diagnosis not present

## 2019-01-16 DIAGNOSIS — I10 Essential (primary) hypertension: Secondary | ICD-10-CM | POA: Diagnosis not present

## 2019-01-16 DIAGNOSIS — H409 Unspecified glaucoma: Secondary | ICD-10-CM | POA: Diagnosis not present

## 2019-01-16 DIAGNOSIS — Z9181 History of falling: Secondary | ICD-10-CM | POA: Diagnosis not present

## 2019-01-16 DIAGNOSIS — E1040 Type 1 diabetes mellitus with diabetic neuropathy, unspecified: Secondary | ICD-10-CM | POA: Diagnosis not present

## 2019-01-16 DIAGNOSIS — S72001D Fracture of unspecified part of neck of right femur, subsequent encounter for closed fracture with routine healing: Secondary | ICD-10-CM | POA: Diagnosis not present

## 2019-01-18 DIAGNOSIS — S72001D Fracture of unspecified part of neck of right femur, subsequent encounter for closed fracture with routine healing: Secondary | ICD-10-CM | POA: Diagnosis not present

## 2019-01-18 DIAGNOSIS — K219 Gastro-esophageal reflux disease without esophagitis: Secondary | ICD-10-CM | POA: Diagnosis not present

## 2019-01-18 DIAGNOSIS — H3322 Serous retinal detachment, left eye: Secondary | ICD-10-CM | POA: Diagnosis not present

## 2019-01-18 DIAGNOSIS — I10 Essential (primary) hypertension: Secondary | ICD-10-CM | POA: Diagnosis not present

## 2019-01-18 DIAGNOSIS — Z96641 Presence of right artificial hip joint: Secondary | ICD-10-CM | POA: Diagnosis not present

## 2019-01-18 DIAGNOSIS — Z9181 History of falling: Secondary | ICD-10-CM | POA: Diagnosis not present

## 2019-01-18 DIAGNOSIS — E1040 Type 1 diabetes mellitus with diabetic neuropathy, unspecified: Secondary | ICD-10-CM | POA: Diagnosis not present

## 2019-01-18 DIAGNOSIS — H409 Unspecified glaucoma: Secondary | ICD-10-CM | POA: Diagnosis not present

## 2019-01-23 DIAGNOSIS — H3322 Serous retinal detachment, left eye: Secondary | ICD-10-CM | POA: Diagnosis not present

## 2019-01-23 DIAGNOSIS — E1040 Type 1 diabetes mellitus with diabetic neuropathy, unspecified: Secondary | ICD-10-CM | POA: Diagnosis not present

## 2019-01-23 DIAGNOSIS — H409 Unspecified glaucoma: Secondary | ICD-10-CM | POA: Diagnosis not present

## 2019-01-23 DIAGNOSIS — Z96641 Presence of right artificial hip joint: Secondary | ICD-10-CM | POA: Diagnosis not present

## 2019-01-23 DIAGNOSIS — I10 Essential (primary) hypertension: Secondary | ICD-10-CM | POA: Diagnosis not present

## 2019-01-23 DIAGNOSIS — Z9181 History of falling: Secondary | ICD-10-CM | POA: Diagnosis not present

## 2019-01-23 DIAGNOSIS — K219 Gastro-esophageal reflux disease without esophagitis: Secondary | ICD-10-CM | POA: Diagnosis not present

## 2019-01-23 DIAGNOSIS — S72001D Fracture of unspecified part of neck of right femur, subsequent encounter for closed fracture with routine healing: Secondary | ICD-10-CM | POA: Diagnosis not present

## 2019-01-26 DIAGNOSIS — E103593 Type 1 diabetes mellitus with proliferative diabetic retinopathy without macular edema, bilateral: Secondary | ICD-10-CM | POA: Diagnosis not present

## 2019-01-26 DIAGNOSIS — H4311 Vitreous hemorrhage, right eye: Secondary | ICD-10-CM | POA: Diagnosis not present

## 2019-01-26 DIAGNOSIS — H401113 Primary open-angle glaucoma, right eye, severe stage: Secondary | ICD-10-CM | POA: Diagnosis not present

## 2019-01-26 DIAGNOSIS — H35352 Cystoid macular degeneration, left eye: Secondary | ICD-10-CM | POA: Diagnosis not present

## 2019-01-30 DIAGNOSIS — H401113 Primary open-angle glaucoma, right eye, severe stage: Secondary | ICD-10-CM | POA: Diagnosis not present

## 2019-04-06 DIAGNOSIS — I5189 Other ill-defined heart diseases: Secondary | ICD-10-CM | POA: Diagnosis not present

## 2019-04-06 DIAGNOSIS — H3520 Other non-diabetic proliferative retinopathy, unspecified eye: Secondary | ICD-10-CM | POA: Diagnosis not present

## 2019-04-06 DIAGNOSIS — I10 Essential (primary) hypertension: Secondary | ICD-10-CM | POA: Diagnosis not present

## 2019-04-06 DIAGNOSIS — E10319 Type 1 diabetes mellitus with unspecified diabetic retinopathy without macular edema: Secondary | ICD-10-CM | POA: Diagnosis not present

## 2019-05-06 DIAGNOSIS — Z23 Encounter for immunization: Secondary | ICD-10-CM | POA: Diagnosis not present

## 2019-05-08 DIAGNOSIS — I10 Essential (primary) hypertension: Secondary | ICD-10-CM | POA: Diagnosis not present

## 2019-05-08 DIAGNOSIS — E871 Hypo-osmolality and hyponatremia: Secondary | ICD-10-CM | POA: Diagnosis not present

## 2019-05-08 DIAGNOSIS — E559 Vitamin D deficiency, unspecified: Secondary | ICD-10-CM | POA: Diagnosis not present

## 2019-05-08 DIAGNOSIS — E10319 Type 1 diabetes mellitus with unspecified diabetic retinopathy without macular edema: Secondary | ICD-10-CM | POA: Diagnosis not present

## 2019-06-05 DIAGNOSIS — H401113 Primary open-angle glaucoma, right eye, severe stage: Secondary | ICD-10-CM | POA: Diagnosis not present

## 2019-07-12 DIAGNOSIS — H401113 Primary open-angle glaucoma, right eye, severe stage: Secondary | ICD-10-CM | POA: Diagnosis not present

## 2019-07-27 DIAGNOSIS — N1831 Chronic kidney disease, stage 3a: Secondary | ICD-10-CM | POA: Diagnosis not present

## 2019-07-27 DIAGNOSIS — I700 Atherosclerosis of aorta: Secondary | ICD-10-CM | POA: Diagnosis not present

## 2019-07-27 DIAGNOSIS — E10319 Type 1 diabetes mellitus with unspecified diabetic retinopathy without macular edema: Secondary | ICD-10-CM | POA: Diagnosis not present

## 2019-07-27 DIAGNOSIS — I10 Essential (primary) hypertension: Secondary | ICD-10-CM | POA: Diagnosis not present

## 2019-07-27 DIAGNOSIS — H3520 Other non-diabetic proliferative retinopathy, unspecified eye: Secondary | ICD-10-CM | POA: Diagnosis not present

## 2019-10-16 DIAGNOSIS — H401113 Primary open-angle glaucoma, right eye, severe stage: Secondary | ICD-10-CM | POA: Diagnosis not present

## 2019-11-02 DIAGNOSIS — E103593 Type 1 diabetes mellitus with proliferative diabetic retinopathy without macular edema, bilateral: Secondary | ICD-10-CM | POA: Diagnosis not present

## 2019-11-02 DIAGNOSIS — H35352 Cystoid macular degeneration, left eye: Secondary | ICD-10-CM | POA: Diagnosis not present

## 2019-11-02 DIAGNOSIS — H33002 Unspecified retinal detachment with retinal break, left eye: Secondary | ICD-10-CM | POA: Diagnosis not present

## 2019-11-02 DIAGNOSIS — H4311 Vitreous hemorrhage, right eye: Secondary | ICD-10-CM | POA: Diagnosis not present

## 2019-11-30 DIAGNOSIS — Z23 Encounter for immunization: Secondary | ICD-10-CM | POA: Diagnosis not present

## 2019-12-07 DIAGNOSIS — N1831 Chronic kidney disease, stage 3a: Secondary | ICD-10-CM | POA: Diagnosis not present

## 2019-12-07 DIAGNOSIS — E10319 Type 1 diabetes mellitus with unspecified diabetic retinopathy without macular edema: Secondary | ICD-10-CM | POA: Diagnosis not present

## 2019-12-07 DIAGNOSIS — H401113 Primary open-angle glaucoma, right eye, severe stage: Secondary | ICD-10-CM | POA: Diagnosis not present

## 2019-12-07 DIAGNOSIS — Z794 Long term (current) use of insulin: Secondary | ICD-10-CM | POA: Diagnosis not present

## 2019-12-13 DIAGNOSIS — H401113 Primary open-angle glaucoma, right eye, severe stage: Secondary | ICD-10-CM | POA: Diagnosis not present

## 2019-12-18 DIAGNOSIS — H401113 Primary open-angle glaucoma, right eye, severe stage: Secondary | ICD-10-CM | POA: Diagnosis not present

## 2019-12-28 DIAGNOSIS — Z23 Encounter for immunization: Secondary | ICD-10-CM | POA: Diagnosis not present

## 2020-06-15 DIAGNOSIS — Z23 Encounter for immunization: Secondary | ICD-10-CM | POA: Diagnosis not present

## 2020-06-24 DIAGNOSIS — H401113 Primary open-angle glaucoma, right eye, severe stage: Secondary | ICD-10-CM | POA: Diagnosis not present

## 2020-07-18 DIAGNOSIS — H4311 Vitreous hemorrhage, right eye: Secondary | ICD-10-CM | POA: Diagnosis not present

## 2020-07-18 DIAGNOSIS — H33002 Unspecified retinal detachment with retinal break, left eye: Secondary | ICD-10-CM | POA: Diagnosis not present

## 2020-07-18 DIAGNOSIS — Z961 Presence of intraocular lens: Secondary | ICD-10-CM | POA: Diagnosis not present

## 2020-07-18 DIAGNOSIS — E113593 Type 2 diabetes mellitus with proliferative diabetic retinopathy without macular edema, bilateral: Secondary | ICD-10-CM | POA: Diagnosis not present

## 2020-07-18 DIAGNOSIS — H35352 Cystoid macular degeneration, left eye: Secondary | ICD-10-CM | POA: Diagnosis not present

## 2020-08-05 DIAGNOSIS — H401113 Primary open-angle glaucoma, right eye, severe stage: Secondary | ICD-10-CM | POA: Diagnosis not present

## 2020-08-27 DIAGNOSIS — K219 Gastro-esophageal reflux disease without esophagitis: Secondary | ICD-10-CM | POA: Diagnosis not present

## 2020-08-30 DIAGNOSIS — I6529 Occlusion and stenosis of unspecified carotid artery: Secondary | ICD-10-CM | POA: Diagnosis not present

## 2020-08-30 DIAGNOSIS — I700 Atherosclerosis of aorta: Secondary | ICD-10-CM | POA: Diagnosis not present

## 2020-08-30 DIAGNOSIS — E103553 Type 1 diabetes mellitus with stable proliferative diabetic retinopathy, bilateral: Secondary | ICD-10-CM | POA: Diagnosis not present

## 2020-08-30 DIAGNOSIS — N1831 Chronic kidney disease, stage 3a: Secondary | ICD-10-CM | POA: Diagnosis not present

## 2020-09-19 DIAGNOSIS — K219 Gastro-esophageal reflux disease without esophagitis: Secondary | ICD-10-CM | POA: Diagnosis not present

## 2020-11-05 DIAGNOSIS — I129 Hypertensive chronic kidney disease with stage 1 through stage 4 chronic kidney disease, or unspecified chronic kidney disease: Secondary | ICD-10-CM | POA: Diagnosis not present

## 2020-11-05 DIAGNOSIS — F419 Anxiety disorder, unspecified: Secondary | ICD-10-CM | POA: Diagnosis not present

## 2020-11-05 DIAGNOSIS — N1831 Chronic kidney disease, stage 3a: Secondary | ICD-10-CM | POA: Diagnosis not present

## 2020-11-05 DIAGNOSIS — R519 Headache, unspecified: Secondary | ICD-10-CM | POA: Diagnosis not present

## 2020-11-19 DIAGNOSIS — N1831 Chronic kidney disease, stage 3a: Secondary | ICD-10-CM | POA: Diagnosis not present

## 2020-12-11 DIAGNOSIS — H401113 Primary open-angle glaucoma, right eye, severe stage: Secondary | ICD-10-CM | POA: Diagnosis not present

## 2021-01-09 DIAGNOSIS — L509 Urticaria, unspecified: Secondary | ICD-10-CM | POA: Diagnosis not present

## 2021-01-16 DIAGNOSIS — H4311 Vitreous hemorrhage, right eye: Secondary | ICD-10-CM | POA: Diagnosis not present

## 2021-01-16 DIAGNOSIS — H33002 Unspecified retinal detachment with retinal break, left eye: Secondary | ICD-10-CM | POA: Diagnosis not present

## 2021-01-16 DIAGNOSIS — E103593 Type 1 diabetes mellitus with proliferative diabetic retinopathy without macular edema, bilateral: Secondary | ICD-10-CM | POA: Diagnosis not present

## 2021-01-16 DIAGNOSIS — H401113 Primary open-angle glaucoma, right eye, severe stage: Secondary | ICD-10-CM | POA: Diagnosis not present

## 2021-02-24 DIAGNOSIS — H401113 Primary open-angle glaucoma, right eye, severe stage: Secondary | ICD-10-CM | POA: Diagnosis not present

## 2021-04-08 DIAGNOSIS — I700 Atherosclerosis of aorta: Secondary | ICD-10-CM | POA: Diagnosis not present

## 2021-04-08 DIAGNOSIS — N1831 Chronic kidney disease, stage 3a: Secondary | ICD-10-CM | POA: Diagnosis not present

## 2021-04-08 DIAGNOSIS — E10319 Type 1 diabetes mellitus with unspecified diabetic retinopathy without macular edema: Secondary | ICD-10-CM | POA: Diagnosis not present

## 2021-04-08 DIAGNOSIS — E103553 Type 1 diabetes mellitus with stable proliferative diabetic retinopathy, bilateral: Secondary | ICD-10-CM | POA: Diagnosis not present

## 2021-05-26 DIAGNOSIS — M25561 Pain in right knee: Secondary | ICD-10-CM | POA: Diagnosis not present

## 2021-06-11 DIAGNOSIS — R300 Dysuria: Secondary | ICD-10-CM | POA: Diagnosis not present

## 2021-06-14 DIAGNOSIS — Z23 Encounter for immunization: Secondary | ICD-10-CM | POA: Diagnosis not present

## 2021-06-30 DIAGNOSIS — M1711 Unilateral primary osteoarthritis, right knee: Secondary | ICD-10-CM | POA: Diagnosis not present

## 2021-07-07 DIAGNOSIS — M1711 Unilateral primary osteoarthritis, right knee: Secondary | ICD-10-CM | POA: Diagnosis not present

## 2021-07-14 DIAGNOSIS — M1711 Unilateral primary osteoarthritis, right knee: Secondary | ICD-10-CM | POA: Diagnosis not present

## 2021-07-24 DIAGNOSIS — H33002 Unspecified retinal detachment with retinal break, left eye: Secondary | ICD-10-CM | POA: Diagnosis not present

## 2021-07-24 DIAGNOSIS — H4313 Vitreous hemorrhage, bilateral: Secondary | ICD-10-CM | POA: Diagnosis not present

## 2021-07-24 DIAGNOSIS — E103593 Type 1 diabetes mellitus with proliferative diabetic retinopathy without macular edema, bilateral: Secondary | ICD-10-CM | POA: Diagnosis not present

## 2021-07-24 DIAGNOSIS — H35352 Cystoid macular degeneration, left eye: Secondary | ICD-10-CM | POA: Diagnosis not present

## 2021-09-15 DIAGNOSIS — E10319 Type 1 diabetes mellitus with unspecified diabetic retinopathy without macular edema: Secondary | ICD-10-CM | POA: Diagnosis not present

## 2021-09-15 DIAGNOSIS — I700 Atherosclerosis of aorta: Secondary | ICD-10-CM | POA: Diagnosis not present

## 2021-09-15 DIAGNOSIS — E103553 Type 1 diabetes mellitus with stable proliferative diabetic retinopathy, bilateral: Secondary | ICD-10-CM | POA: Diagnosis not present

## 2021-09-15 DIAGNOSIS — N1831 Chronic kidney disease, stage 3a: Secondary | ICD-10-CM | POA: Diagnosis not present

## 2021-10-10 DIAGNOSIS — H401113 Primary open-angle glaucoma, right eye, severe stage: Secondary | ICD-10-CM | POA: Diagnosis not present

## 2021-11-06 DIAGNOSIS — H401113 Primary open-angle glaucoma, right eye, severe stage: Secondary | ICD-10-CM | POA: Diagnosis not present

## 2021-11-19 ENCOUNTER — Telehealth: Payer: Self-pay | Admitting: *Deleted

## 2021-12-16 NOTE — Telephone Encounter (Signed)
error 

## 2021-12-29 DIAGNOSIS — H401113 Primary open-angle glaucoma, right eye, severe stage: Secondary | ICD-10-CM | POA: Diagnosis not present

## 2022-02-13 DIAGNOSIS — M1711 Unilateral primary osteoarthritis, right knee: Secondary | ICD-10-CM | POA: Diagnosis not present

## 2022-02-20 DIAGNOSIS — M1711 Unilateral primary osteoarthritis, right knee: Secondary | ICD-10-CM | POA: Diagnosis not present

## 2022-02-27 DIAGNOSIS — M1711 Unilateral primary osteoarthritis, right knee: Secondary | ICD-10-CM | POA: Diagnosis not present

## 2022-04-08 DIAGNOSIS — H401113 Primary open-angle glaucoma, right eye, severe stage: Secondary | ICD-10-CM | POA: Diagnosis not present

## 2022-06-11 DIAGNOSIS — H401113 Primary open-angle glaucoma, right eye, severe stage: Secondary | ICD-10-CM | POA: Diagnosis not present

## 2022-06-13 DIAGNOSIS — Z23 Encounter for immunization: Secondary | ICD-10-CM | POA: Diagnosis not present

## 2022-07-06 DIAGNOSIS — I129 Hypertensive chronic kidney disease with stage 1 through stage 4 chronic kidney disease, or unspecified chronic kidney disease: Secondary | ICD-10-CM | POA: Diagnosis not present

## 2022-07-06 DIAGNOSIS — I5189 Other ill-defined heart diseases: Secondary | ICD-10-CM | POA: Diagnosis not present

## 2022-07-06 DIAGNOSIS — N1831 Chronic kidney disease, stage 3a: Secondary | ICD-10-CM | POA: Diagnosis not present

## 2022-07-06 DIAGNOSIS — E10319 Type 1 diabetes mellitus with unspecified diabetic retinopathy without macular edema: Secondary | ICD-10-CM | POA: Diagnosis not present

## 2022-07-06 DIAGNOSIS — I700 Atherosclerosis of aorta: Secondary | ICD-10-CM | POA: Diagnosis not present

## 2022-07-06 DIAGNOSIS — I10 Essential (primary) hypertension: Secondary | ICD-10-CM | POA: Diagnosis not present

## 2022-07-20 DIAGNOSIS — H401113 Primary open-angle glaucoma, right eye, severe stage: Secondary | ICD-10-CM | POA: Diagnosis not present

## 2022-09-14 DIAGNOSIS — M1711 Unilateral primary osteoarthritis, right knee: Secondary | ICD-10-CM | POA: Diagnosis not present

## 2022-09-21 DIAGNOSIS — M1711 Unilateral primary osteoarthritis, right knee: Secondary | ICD-10-CM | POA: Diagnosis not present

## 2022-09-28 DIAGNOSIS — M1711 Unilateral primary osteoarthritis, right knee: Secondary | ICD-10-CM | POA: Diagnosis not present

## 2022-11-11 DIAGNOSIS — H401113 Primary open-angle glaucoma, right eye, severe stage: Secondary | ICD-10-CM | POA: Diagnosis not present

## 2022-12-21 DIAGNOSIS — H401113 Primary open-angle glaucoma, right eye, severe stage: Secondary | ICD-10-CM | POA: Diagnosis not present

## 2023-04-26 DIAGNOSIS — H401113 Primary open-angle glaucoma, right eye, severe stage: Secondary | ICD-10-CM | POA: Diagnosis not present

## 2023-05-21 DIAGNOSIS — M1711 Unilateral primary osteoarthritis, right knee: Secondary | ICD-10-CM | POA: Diagnosis not present

## 2023-05-28 DIAGNOSIS — M1711 Unilateral primary osteoarthritis, right knee: Secondary | ICD-10-CM | POA: Diagnosis not present

## 2023-06-04 DIAGNOSIS — M1711 Unilateral primary osteoarthritis, right knee: Secondary | ICD-10-CM | POA: Diagnosis not present

## 2023-06-12 DIAGNOSIS — Z23 Encounter for immunization: Secondary | ICD-10-CM | POA: Diagnosis not present

## 2023-07-08 DIAGNOSIS — I1310 Hypertensive heart and chronic kidney disease without heart failure, with stage 1 through stage 4 chronic kidney disease, or unspecified chronic kidney disease: Secondary | ICD-10-CM | POA: Diagnosis not present

## 2023-07-08 DIAGNOSIS — E10319 Type 1 diabetes mellitus with unspecified diabetic retinopathy without macular edema: Secondary | ICD-10-CM | POA: Diagnosis not present

## 2023-07-08 DIAGNOSIS — I6529 Occlusion and stenosis of unspecified carotid artery: Secondary | ICD-10-CM | POA: Diagnosis not present

## 2023-07-08 DIAGNOSIS — E559 Vitamin D deficiency, unspecified: Secondary | ICD-10-CM | POA: Diagnosis not present

## 2023-07-08 DIAGNOSIS — K279 Peptic ulcer, site unspecified, unspecified as acute or chronic, without hemorrhage or perforation: Secondary | ICD-10-CM | POA: Diagnosis not present

## 2023-07-08 DIAGNOSIS — E103553 Type 1 diabetes mellitus with stable proliferative diabetic retinopathy, bilateral: Secondary | ICD-10-CM | POA: Diagnosis not present

## 2023-07-12 ENCOUNTER — Other Ambulatory Visit (HOSPITAL_COMMUNITY): Payer: Self-pay | Admitting: Endocrinology

## 2023-07-12 DIAGNOSIS — E10319 Type 1 diabetes mellitus with unspecified diabetic retinopathy without macular edema: Secondary | ICD-10-CM

## 2023-07-13 ENCOUNTER — Other Ambulatory Visit (HOSPITAL_COMMUNITY): Payer: Self-pay | Admitting: Endocrinology

## 2023-07-13 ENCOUNTER — Ambulatory Visit (HOSPITAL_COMMUNITY)
Admission: RE | Admit: 2023-07-13 | Discharge: 2023-07-13 | Disposition: A | Discharge status: Home / Self Care | Payer: Medicare Other | Source: Ambulatory Visit | Attending: Endocrinology | Admitting: Endocrinology

## 2023-07-13 DIAGNOSIS — I6782 Cerebral ischemia: Secondary | ICD-10-CM | POA: Insufficient documentation

## 2023-07-13 DIAGNOSIS — E10319 Type 1 diabetes mellitus with unspecified diabetic retinopathy without macular edema: Secondary | ICD-10-CM

## 2023-07-13 DIAGNOSIS — G9389 Other specified disorders of brain: Secondary | ICD-10-CM | POA: Diagnosis not present

## 2023-07-14 ENCOUNTER — Ambulatory Visit (HOSPITAL_COMMUNITY)
Admission: RE | Admit: 2023-07-14 | Discharge: 2023-07-14 | Disposition: A | Discharge status: Home / Self Care | Payer: Medicare Other | Source: Ambulatory Visit | Attending: Vascular Surgery | Admitting: Vascular Surgery

## 2023-07-14 ENCOUNTER — Other Ambulatory Visit (HOSPITAL_COMMUNITY): Payer: Self-pay | Admitting: Endocrinology

## 2023-07-14 DIAGNOSIS — E10319 Type 1 diabetes mellitus with unspecified diabetic retinopathy without macular edema: Secondary | ICD-10-CM

## 2023-07-29 ENCOUNTER — Ambulatory Visit: Payer: Medicare Other | Admitting: Neurology

## 2023-07-29 ENCOUNTER — Telehealth: Payer: Self-pay | Admitting: Neurology

## 2023-07-29 ENCOUNTER — Encounter: Payer: Self-pay | Admitting: Neurology

## 2023-07-29 VITALS — BP 132/72 | HR 87 | Ht 60.0 in

## 2023-07-29 DIAGNOSIS — R202 Paresthesia of skin: Secondary | ICD-10-CM | POA: Diagnosis not present

## 2023-07-29 DIAGNOSIS — G459 Transient cerebral ischemic attack, unspecified: Secondary | ICD-10-CM | POA: Diagnosis not present

## 2023-07-29 NOTE — Telephone Encounter (Signed)
[employer] medicare Berkley Harvey: [phone] exp. 07/29/23-08/27/23 sent to GI [phone]

## 2023-07-29 NOTE — Progress Notes (Signed)
Chief Complaint  Patient presents with   New Patient (Initial Visit)    Rm14, sister present, NP/urgent Paper/[facility]/Stephen Saint Martin MD [phone] TIA: pt feels like she is getting back to normal       ASSESSMENT AND PLAN  [name] is a 67 y.o. female   TIA  Presenting with transient left hand and foot numbness,  Multiple vascular risk factors including 60 years history of type 1 diabetes, diabetic peripheral neuropathy, retinopathy, hypertension, sedentary lifestyle,  Suggest aspirin 81 mg daily, moderate exercise, increase water intake,  Complete evaluation with echocardiogram, MRI of the brain,  DIAGNOSTIC DATA (LABS, IMAGING, TESTING) - I reviewed patient records, labs, notes, testing and imaging myself where available.   MEDICAL HISTORY:  [name], is a 67 year old female, accompanied by her sister seen in request by her primary care from [facility] Dr.  Evlyn Kanner, Jeannett Senior for evaluation of abnormal CT  head, TIA, initial evaluation July 29, 2023   History is obtained from the patient and review of electronic medical records. I personally reviewed pertinent available imaging films in PACS.   PMHx of  HTN DM-Type I since 67 years old DM peripheral neuropathy Left retinal detachment, retinopathy Right hip replacement Left Cataract Chronic right knee pain, history of right knee fracture.  She had history of type 1 diabetes since age 45, overall under suboptimal control, A1c was around 8, mild complications, including diabetic peripheral neuropathy numbness tingling of bilateral feet, retinopathy  In addition, she suffered severe right leg injury from a fall accident, had surgery, but still have significant right knee pain, limited range of motion of right knee, sedentary lifestyle  On June 28, 2023, she had a sudden onset of left hand and foot tingly, stinging sensation, with mild transient elevated blood pressure, but no  weakness, also has headache, she took aspirin, symptoms gradually resolved within 30 minutes  She was referred to have CT head on Nov 26th by her primary care physician, 1. No evidence of acute intracranial abnormality. 2. Moderate chronic small vessel ischemic disease.  US Carotid Artery in Nov 2024: Right Carotid: Velocities in the right ICA are consistent with a 1-39%  stenosis.   Left Carotid: There is no evidence of stenosis in the left ICA.   Vertebrals:  Bilateral vertebral arteries demonstrate antegrade flow.  Subclavians: Normal flow hemodynamics were seen in bilateral subclavian  arteries.   Since the event, she was instructed to take aspirin 81 mg every other day  PHYSICAL EXAM:   Vitals:   07/29/23 0934  BP: 132/72  Pulse: 87  SpO2: 97%  Height: 5' (1.524 m)    Body mass index is 28.12 kg/m.  PHYSICAL EXAMNIATION:  Gen: NAD, conversant, well nourised, well groomed                     Cardiovascular: Regular rate rhythm, no peripheral edema, warm, nontender. Eyes: Conjunctivae clear without exudates or hemorrhage Neck: Supple, no carotid bruits. Pulmonary: Clear to auscultation bilaterally   NEUROLOGICAL EXAM:  MENTAL STATUS: Speech/cognition: Awake, alert, oriented to history taking and casual conversation CRANIAL NERVES: CN II: Visual fields are full to confrontation. Pupils are round equal and briskly reactive to light. CN III, IV, VI: extraocular movement are normal. No ptosis. CN V: Facial sensation is intact to light touch CN VII: Face is symmetric with normal eye closure  CN VIII: Hearing is normal to causal conversation. CN IX, X: Phonation is normal. CN XI: Head turning  and shoulder shrug are intact  MOTOR: No significant weakness, but right lower extremity examination is limited because of right knee pain, limited range of motion, history of previous right knee surgery,  REFLEXES: Reflexes are 1 and symmetric at the biceps, triceps, knees,  and ankles. Plantar responses are flexor.  SENSORY: Mild length-dependent decreased light touch pinprick vibratory sensation  COORDINATION: There is no trunk or limb dysmetria noted.  GAIT/STANCE: Rely on her walker, need pushing up to get up from seated position, dragging right leg,  REVIEW OF SYSTEMS:  Full 14 system review of systems performed and notable only for as above All other review of systems were negative.   ALLERGIES: Allergies  Allergen Reactions   Ciprofloxacin Other (See Comments)    Breathing problems   Cephalosporins Other (See Comments)    Headache    Garlic Diarrhea and Nausea And Vomiting   Macrobid [Nitrofurantoin] Other (See Comments)    Headaches    Onion Diarrhea and Nausea And Vomiting   Sulfa Antibiotics Rash    Sensitive to light    HOME MEDICATIONS: Current Outpatient Medications  Medication Sig Dispense Refill   amLODipine (NORVASC) 5 MG tablet Take 5 mg by mouth daily.     aspirin EC 81 MG tablet Take 1 tablet (81 mg total) by mouth 2 (two) times daily. For DVT prophylaxis for 30 days after surgery. 60 tablet 0   dorzolamide-timolol (COSOPT) 22.3-6.8 MG/ML ophthalmic solution Place 1 drop into both eyes 2 (two) times daily.     ergocalciferol (VITAMIN D2) 50000 UNITS capsule Take 50,000 Units by mouth every Wednesday.      HUMALOG 100 UNIT/ML injection Inject 6 Units into the skin once.     insulin aspart (NOVOLOG) 100 UNIT/ML injection Inject 5-8 Units into the skin 3 (three) times daily before meals.      insulin glargine (LANTUS) 100 UNIT/ML injection Inject 9 Units into the skin at bedtime.      latanoprost (XALATAN) 0.005 % ophthalmic solution Place 1 drop into both eyes at bedtime.     losartan (COZAAR) 100 MG tablet Take 100 mg by mouth daily.     omeprazole (PRILOSEC OTC) 20 MG tablet Take 40 mg by mouth daily.      ondansetron (ZOFRAN) 4 MG tablet Take 1 tablet (4 mg total) by mouth every 8 (eight) hours as needed for nausea or  vomiting. 20 tablet 0   quinapril (ACCUPRIL) 40 MG tablet Take 40 mg by mouth at bedtime.     sucralfate (CARAFATE) 1 G tablet Take 1 tablet (1 g total) by mouth 4 (four) times daily. 30 tablet 0   gabapentin (NEURONTIN) 100 MG capsule Take 1 capsule (100 mg total) by mouth 3 (three) times daily as needed for up to 10 days (Pain). For pain. 30 capsule 0   No current facility-administered medications for this visit.    PAST MEDICAL HISTORY: Past Medical History:  Diagnosis Date   Cataract    right eye   Diabetes mellitus without complication (HCC)    type 1   GERD (gastroesophageal reflux disease)    Glaucoma    right eye   Heart murmur    never has caused any problems   History of blood transfusion    with hysterectomy surgery   Hypertension    Neuropathy    patient denies this dx   Seasonal allergies    Ulcer of the stomach and intestine    Wheelchair bound    temporary  PAST SURGICAL HISTORY: Past Surgical History:  Procedure Laterality Date   ABDOMINAL HYSTERECTOMY     BREAST SURGERY Bilateral    bx - benign on both sides   CATARACT EXTRACTION Left    COLONOSCOPY     EYE SURGERY     laser for diabetic retinopathy   FRACTURE SURGERY     right leg tib/fib -with metal plate   RETINAL DETACHMENT SURGERY Left    [facility] [place]   TONSILLECTOMY     TONSILLECTOMY AND ADENOIDECTOMY     TOTAL HIP ARTHROPLASTY Right 11/22/2018   Procedure: TOTAL HIP ARTHROPLASTY ANTERIOR APPROACH;  Surgeon: Sheral Apley, MD;  Location: [facility] OR;  Service: Orthopedics;  Laterality: Right;   UPPER GI ENDOSCOPY     ulcer   WISDOM TOOTH EXTRACTION      FAMILY HISTORY: History reviewed. No pertinent family history.  SOCIAL HISTORY: Social History   Socioeconomic History   Marital status: Single    Spouse name: Not on file   Number of children: Not on file   Years of education: Not on file   Highest education level: Not on file  Occupational History   Not on file   Tobacco Use   Smoking status: Never   Smokeless tobacco: Never  Vaping Use   Vaping status: Never Used  Substance and Sexual Activity   Alcohol use: No   Drug use: No   Sexual activity: Not on file    Comment: Hysterectomy   Other Topics Concern   Not on file  Social History Narrative   Not on file   Social Drivers of Health   Financial Resource Strain: Not on file  Food Insecurity: Not on file  Transportation Needs: Not on file  Physical Activity: Not on file  Stress: Not on file  Social Connections: Not on file  Intimate Partner Violence: Not on file      Levert Feinstein, M.D. Ph.D.  [facility] [address] [place], [place] [address] Ph: [phone] Fax: [phone]  CC:  Adrian Prince, MD [address] [place],  [place] [address]  Adrian Prince, MD

## 2023-08-19 ENCOUNTER — Ambulatory Visit (HOSPITAL_COMMUNITY)
Admission: RE | Admit: 2023-08-19 | Discharge: 2023-08-19 | Disposition: A | Discharge status: Home / Self Care | Payer: Medicare Other | Source: Ambulatory Visit | Attending: Neurology | Admitting: Neurology

## 2023-08-19 DIAGNOSIS — R202 Paresthesia of skin: Secondary | ICD-10-CM | POA: Insufficient documentation

## 2023-08-19 DIAGNOSIS — E119 Type 2 diabetes mellitus without complications: Secondary | ICD-10-CM | POA: Diagnosis not present

## 2023-08-19 DIAGNOSIS — G459 Transient cerebral ischemic attack, unspecified: Secondary | ICD-10-CM | POA: Insufficient documentation

## 2023-08-19 LAB — ECHOCARDIOGRAM COMPLETE
AR max vel: 1.06 cm2
AV Area VTI: 1.15 cm2
AV Area mean vel: 1.06 cm2
AV Mean grad: 4 mm[Hg]
AV Peak grad: 9.9 mm[Hg]
Ao pk vel: 1.57 m/s
Area-P 1/2: 7.74 cm2
Calc EF: 58 %
MV VTI: 1.82 cm2
S' Lateral: 2.1 cm
Single Plane A2C EF: 60.8 %
Single Plane A4C EF: 60.2 %

## 2023-08-19 NOTE — Progress Notes (Signed)
  Echocardiogram 2D Echocardiogram has been performed.  Ocie Doyne RDCS 08/19/2023, 1:30 PM

## 2023-08-20 ENCOUNTER — Other Ambulatory Visit: Payer: Medicare Other

## 2023-09-05 ENCOUNTER — Other Ambulatory Visit: Payer: Medicare Other

## 2023-09-12 ENCOUNTER — Ambulatory Visit
Admission: RE | Admit: 2023-09-12 | Discharge: 2023-09-12 | Disposition: A | Discharge status: Home / Self Care | Payer: Medicare Other | Source: Ambulatory Visit | Attending: Neurology | Admitting: Neurology

## 2023-09-12 DIAGNOSIS — R202 Paresthesia of skin: Secondary | ICD-10-CM | POA: Diagnosis not present

## 2023-09-12 DIAGNOSIS — G459 Transient cerebral ischemic attack, unspecified: Secondary | ICD-10-CM

## 2023-09-13 ENCOUNTER — Encounter: Payer: Self-pay | Admitting: Neurology

## 2024-02-07 DIAGNOSIS — M1711 Unilateral primary osteoarthritis, right knee: Secondary | ICD-10-CM | POA: Diagnosis not present

## 2024-02-25 DIAGNOSIS — M1711 Unilateral primary osteoarthritis, right knee: Secondary | ICD-10-CM | POA: Diagnosis not present

## 2024-03-03 DIAGNOSIS — M1711 Unilateral primary osteoarthritis, right knee: Secondary | ICD-10-CM | POA: Diagnosis not present

## 2024-03-10 DIAGNOSIS — M1711 Unilateral primary osteoarthritis, right knee: Secondary | ICD-10-CM | POA: Diagnosis not present

## 2024-06-15 DIAGNOSIS — Z23 Encounter for immunization: Secondary | ICD-10-CM | POA: Diagnosis not present

## 2024-07-19 NOTE — Progress Notes (Signed)
 [name]                                          MRN: [id number]   07/19/2024   The [facility] Quality Team Specialist reviewed this patient medical record for the purposes of chart review for care gap closure. The following were reviewed: chart review for care gap closure-glycemic status assessment.    [facility] Quality Team
